# Patient Record
Sex: Male | Born: 1935 | Race: White | Hispanic: No | Smoking: Never smoker
Health system: Southern US, Community
[De-identification: ages and names within clinical notes are randomized; demographics above are authoritative.]

## PROBLEM LIST (undated history)

## (undated) DIAGNOSIS — F329 Major depressive disorder, single episode, unspecified: Secondary | ICD-10-CM

## (undated) DIAGNOSIS — E039 Hypothyroidism, unspecified: Secondary | ICD-10-CM

## (undated) DIAGNOSIS — M1990 Unspecified osteoarthritis, unspecified site: Secondary | ICD-10-CM

## (undated) DIAGNOSIS — K5900 Constipation, unspecified: Secondary | ICD-10-CM

---

## 2021-01-08 ENCOUNTER — Observation Stay
Admission: EM | Admit: 2021-01-08 | Discharge: 2021-01-10 | Disposition: A | Discharge status: Home / Self Care | Payer: Medicare Other | Attending: Internal Medicine | Admitting: Internal Medicine

## 2021-01-08 ENCOUNTER — Emergency Department: Payer: Medicare Other

## 2021-01-08 ENCOUNTER — Other Ambulatory Visit: Payer: Self-pay

## 2021-01-08 DIAGNOSIS — R4182 Altered mental status, unspecified: Secondary | ICD-10-CM | POA: Diagnosis not present

## 2021-01-08 DIAGNOSIS — Z79899 Other long term (current) drug therapy: Secondary | ICD-10-CM | POA: Insufficient documentation

## 2021-01-08 DIAGNOSIS — N179 Acute kidney failure, unspecified: Secondary | ICD-10-CM

## 2021-01-08 DIAGNOSIS — W19XXXA Unspecified fall, initial encounter: Secondary | ICD-10-CM | POA: Insufficient documentation

## 2021-01-08 DIAGNOSIS — R778 Other specified abnormalities of plasma proteins: Secondary | ICD-10-CM | POA: Insufficient documentation

## 2021-01-08 DIAGNOSIS — L8990 Pressure ulcer of unspecified site, unspecified stage: Secondary | ICD-10-CM | POA: Insufficient documentation

## 2021-01-08 DIAGNOSIS — R7989 Other specified abnormal findings of blood chemistry: Secondary | ICD-10-CM | POA: Diagnosis present

## 2021-01-08 DIAGNOSIS — S2232XA Fracture of one rib, left side, initial encounter for closed fracture: Secondary | ICD-10-CM | POA: Diagnosis not present

## 2021-01-08 DIAGNOSIS — Y92129 Unspecified place in nursing home as the place of occurrence of the external cause: Secondary | ICD-10-CM | POA: Diagnosis not present

## 2021-01-08 DIAGNOSIS — S299XXA Unspecified injury of thorax, initial encounter: Secondary | ICD-10-CM | POA: Diagnosis present

## 2021-01-08 DIAGNOSIS — S2239XA Fracture of one rib, unspecified side, initial encounter for closed fracture: Secondary | ICD-10-CM

## 2021-01-08 DIAGNOSIS — R001 Bradycardia, unspecified: Secondary | ICD-10-CM | POA: Diagnosis not present

## 2021-01-08 DIAGNOSIS — Z20822 Contact with and (suspected) exposure to covid-19: Secondary | ICD-10-CM | POA: Insufficient documentation

## 2021-01-08 DIAGNOSIS — E039 Hypothyroidism, unspecified: Secondary | ICD-10-CM | POA: Diagnosis not present

## 2021-01-08 DIAGNOSIS — R296 Repeated falls: Secondary | ICD-10-CM | POA: Diagnosis not present

## 2021-01-08 HISTORY — DX: Unspecified osteoarthritis, unspecified site: M19.90

## 2021-01-08 HISTORY — DX: Major depressive disorder, single episode, unspecified: F32.9

## 2021-01-08 HISTORY — DX: Constipation, unspecified: K59.00

## 2021-01-08 HISTORY — DX: Hypothyroidism, unspecified: E03.9

## 2021-01-08 LAB — COMPREHENSIVE METABOLIC PANEL
ALT: 31 U/L (ref 0–44)
AST: 74 U/L — ABNORMAL HIGH (ref 15–41)
Albumin: 3.6 g/dL (ref 3.5–5.0)
Alkaline Phosphatase: 40 U/L (ref 38–126)
Anion gap: 5 (ref 5–15)
BUN: 31 mg/dL — ABNORMAL HIGH (ref 8–23)
CO2: 27 mmol/L (ref 22–32)
Calcium: 9.1 mg/dL (ref 8.9–10.3)
Chloride: 98 mmol/L (ref 98–111)
Creatinine, Ser: 1.29 mg/dL — ABNORMAL HIGH (ref 0.61–1.24)
GFR, Estimated: 52 mL/min — ABNORMAL LOW (ref >60)
Glucose, Bld: 120 mg/dL — ABNORMAL HIGH (ref 70–99)
Potassium: 4.2 mmol/L (ref 3.5–5.1)
Sodium: 130 mmol/L — ABNORMAL LOW (ref 135–145)
Total Bilirubin: 1.1 mg/dL (ref 0.3–1.2)
Total Protein: 6 g/dL — ABNORMAL LOW (ref 6.5–8.1)

## 2021-01-08 LAB — CBC WITH DIFFERENTIAL/PLATELET
Abs Immature Granulocytes: 0.02 10*3/uL (ref 0.00–0.07)
Basophils Absolute: 0 10*3/uL (ref 0.0–0.1)
Basophils Relative: 0 %
Eosinophils Absolute: 0.1 10*3/uL (ref 0.0–0.5)
Eosinophils Relative: 2 %
HCT: 36.4 % — ABNORMAL LOW (ref 39.0–52.0)
Hemoglobin: 12.6 g/dL — ABNORMAL LOW (ref 13.0–17.0)
Immature Granulocytes: 0 %
Lymphocytes Relative: 6 %
Lymphs Abs: 0.4 10*3/uL — ABNORMAL LOW (ref 0.7–4.0)
MCH: 32.1 pg (ref 26.0–34.0)
MCHC: 34.6 g/dL (ref 30.0–36.0)
MCV: 92.6 fL (ref 80.0–100.0)
Monocytes Absolute: 0.8 10*3/uL (ref 0.1–1.0)
Monocytes Relative: 12 %
Neutro Abs: 5.1 10*3/uL (ref 1.7–7.7)
Neutrophils Relative %: 80 %
Platelets: 154 10*3/uL (ref 150–400)
RBC: 3.93 MIL/uL — ABNORMAL LOW (ref 4.22–5.81)
RDW: 14.6 % (ref 11.5–15.5)
WBC: 6.4 10*3/uL (ref 4.0–10.5)
nRBC: 0 % (ref 0.0–0.2)

## 2021-01-08 LAB — RESP PANEL BY RT-PCR (FLU A&B, COVID) ARPGX2
Influenza A by PCR: NEGATIVE
Influenza B by PCR: NEGATIVE
SARS Coronavirus 2 by RT PCR: NEGATIVE

## 2021-01-08 LAB — TROPONIN I (HIGH SENSITIVITY): Troponin I (High Sensitivity): 781 ng/L (ref <18)

## 2021-01-08 LAB — TSH: TSH: 2.17 u[IU]/mL (ref 0.350–4.500)

## 2021-01-08 MED ORDER — ASPIRIN 81 MG PO CHEW
324.0000 mg | CHEWABLE_TABLET | Freq: Once | ORAL | Status: AC
  Administered 2021-01-08: 324 mg via ORAL
  Filled 2021-01-08: qty 4

## 2021-01-08 MED ORDER — LACTATED RINGERS IV BOLUS
1000.0000 mL | Freq: Once | INTRAVENOUS | Status: AC
  Administered 2021-01-08: 1000 mL via INTRAVENOUS

## 2021-01-08 NOTE — ED Triage Notes (Signed)
Patient from [facility] with 3 falls since yesterday, denies any complaint of pain or injury.  Patient awake and alert.

## 2021-01-08 NOTE — ED Provider Notes (Signed)
[facility] Emergency Department Provider Note   ____________________________________________   Event Date/Time   First MD Initiated Contact with Patient 01/08/21 1916     (approximate)  I have reviewed the triage vital signs and the nursing notes.   HISTORY  Chief Complaint Fall (Patient from [facility], EMS states staff told them patient has fallen 3 x since yesterday, most recent fall was 30 minutes PTA.  Patient has no complaints of pain. No injury noted.)    HPI [name] [name] is a 87 y.o. male with past medical history of hypothyroidism, osteoarthritis, and major depressive disorder who presents to the ED for falls.  History is limited due to patient's confusion.  Patient states that he has been falling lately due to issues with his balance, per nursing facility where he lives he has had 3 falls in the past 24 hours.  He denies hitting his head or losing consciousness with the fall today and denies any pain.  He states his balance has been getting worse but he denies any numbness or weakness.  He denies any fevers, cough, chest pain, shortness of breath, vomiting, diarrhea, or dysuria.  He does not take any blood thinners.        Past Medical History:  Diagnosis Date   Constipation    Hypothyroid    Major depressive disorder    Osteoarthritis (arthritis due to wear and tear of joints)     There are no problems to display for this patient.   History reviewed. No pertinent surgical history.  Prior to Admission medications   Not on File    Allergies Patient has no known allergies.  History reviewed. No pertinent family history.  Social History    Review of Systems  Constitutional: No fever/chills.  Positive for multiple falls. Eyes: No visual changes. ENT: No sore throat. Cardiovascular: Denies chest pain. Respiratory: Denies shortness of breath. Gastrointestinal: No abdominal pain.  No nausea, no vomiting.  No diarrhea.  No  constipation. Genitourinary: Negative for dysuria. Musculoskeletal: Negative for back pain. Skin: Negative for rash. Neurological: Negative for headaches, focal weakness or numbness.  ____________________________________________   PHYSICAL EXAM:  VITAL SIGNS: ED Triage Vitals  Enc Vitals Group     BP 01/08/21 1843 (!) 126/43     Pulse Rate 01/08/21 1843 66     Resp 01/08/21 1843 20     Temp 01/08/21 1843 98 F (36.7 C)     Temp src --      SpO2 01/08/21 1843 93 %     Weight 01/08/21 1845 185 lb 9.6 oz (84.2 kg)     Height 01/08/21 1845 5\' 8"  (1.727 m)     Head Circumference --      Peak Flow --      Pain Score 01/08/21 1845 0     Pain Loc --      Pain Edu? --      Excl. in GC? --     Constitutional: Alert and oriented to person and place, but not time or situation. Eyes: Conjunctivae are normal. Head: Atraumatic. Nose: No congestion/rhinnorhea. Mouth/Throat: Mucous membranes are moist. Neck: Normal ROM Cardiovascular: Normal rate, regular rhythm. Grossly normal heart sounds.  2+ radial pulses bilaterally. Respiratory: Normal respiratory effort.  No retractions. Lungs CTAB. Gastrointestinal: Soft and nontender. No distention. Genitourinary: deferred Musculoskeletal: No lower extremity tenderness nor edema.  No upper extremity bony tenderness to palpation. Neurologic:  Normal speech and language. No gross focal neurologic deficits are appreciated. Skin:  Skin is warm, dry and intact. No rash noted. Psychiatric: Mood and affect are normal. Speech and behavior are normal.  ____________________________________________   LABS (all labs ordered are listed, but only abnormal results are displayed)  Labs Reviewed  CBC WITH DIFFERENTIAL/PLATELET - Abnormal; Notable for the following components:      Result Value   RBC 3.93 (*)    Hemoglobin 12.6 (*)    HCT 36.4 (*)    Lymphs Abs 0.4 (*)    All other components within normal limits  COMPREHENSIVE METABOLIC PANEL -  Abnormal; Notable for the following components:   Sodium 130 (*)    Glucose, Bld 120 (*)    BUN 31 (*)    Creatinine, Ser 1.29 (*)    Total Protein 6.0 (*)    AST 74 (*)    GFR, Estimated 52 (*)    All other components within normal limits  TROPONIN I (HIGH SENSITIVITY) - Abnormal; Notable for the following components:   Troponin I (High Sensitivity) 781 (*)    All other components within normal limits  RESP PANEL BY RT-PCR (FLU A&B, COVID) ARPGX2  TSH  URINALYSIS, COMPLETE (UACMP) WITH MICROSCOPIC  TROPONIN I (HIGH SENSITIVITY)   ____________________________________________  EKG  ED ECG REPORT I, Chesley Noon, the attending physician, personally viewed and interpreted this ECG.   Date: 01/08/2021  EKG Time: 20:23  Rate: 47  Rhythm: sinus bradycardia  Axis: LAD  Intervals:right bundle branch block and left anterior fascicular block  ST&T Change: None   PROCEDURES  Procedure(s) performed (including Critical Care):  Procedures   ____________________________________________   INITIAL IMPRESSION / ASSESSMENT AND PLAN / ED COURSE      87 year old male with past medical history of hypothyroidism, osteoarthritis, and major depressive disorder who presents to the ED with multiple falls in the past 24 hours.  He denies any pain and does not appear to have any obvious injuries, given his advanced age we will check CT head and cervical spine.  He does appear confused with no history of dementia noted on nursing home paperwork.  No focal neurologic deficits noted, we will further assess for metabolic versus infectious etiology.  CT head and cervical spine are negative for acute process, chest x-ray reviewed by me and shows no infiltrate, edema, or effusion but does note questionable rib fracture.  On reassessment patient with no chest wall tenderness to suggest acute rib fracture.  Labs remarkable for mild AKI along with elevated troponin.  No ischemic changes noted on EKG  however he does appear to be in an irregular sinus bradycardia.  We will give dose of aspirin and trend troponin.  Case discussed with hospitalist for admission, who will follow up repeat troponin and initiate heparin if it is uptrending.      ____________________________________________   FINAL CLINICAL IMPRESSION(S) / ED DIAGNOSES  Final diagnoses:  Multiple falls  Elevated troponin  Altered mental status, unspecified altered mental status type     ED Discharge Orders     None        Note:  This document was prepared using Dragon voice recognition software and may include unintentional dictation errors.    Chesley Noon, MD 01/08/21 2249

## 2021-01-09 ENCOUNTER — Observation Stay
Admit: 2021-01-09 | Discharge: 2021-01-09 | Disposition: A | Discharge status: Home / Self Care | Payer: Medicare Other | Attending: Family Medicine | Admitting: Family Medicine

## 2021-01-09 ENCOUNTER — Encounter: Payer: Self-pay | Admitting: Family Medicine

## 2021-01-09 DIAGNOSIS — R296 Repeated falls: Secondary | ICD-10-CM | POA: Diagnosis not present

## 2021-01-09 DIAGNOSIS — R001 Bradycardia, unspecified: Secondary | ICD-10-CM

## 2021-01-09 DIAGNOSIS — S2232XA Fracture of one rib, left side, initial encounter for closed fracture: Secondary | ICD-10-CM

## 2021-01-09 DIAGNOSIS — R778 Other specified abnormalities of plasma proteins: Secondary | ICD-10-CM | POA: Diagnosis not present

## 2021-01-09 DIAGNOSIS — N179 Acute kidney failure, unspecified: Secondary | ICD-10-CM | POA: Diagnosis not present

## 2021-01-09 DIAGNOSIS — S2239XA Fracture of one rib, unspecified side, initial encounter for closed fracture: Secondary | ICD-10-CM

## 2021-01-09 LAB — COMPREHENSIVE METABOLIC PANEL
ALT: 31 U/L (ref 0–44)
AST: 73 U/L — ABNORMAL HIGH (ref 15–41)
Albumin: 3.7 g/dL (ref 3.5–5.0)
Alkaline Phosphatase: 35 U/L — ABNORMAL LOW (ref 38–126)
Anion gap: 8 (ref 5–15)
BUN: 26 mg/dL — ABNORMAL HIGH (ref 8–23)
CO2: 26 mmol/L (ref 22–32)
Calcium: 9 mg/dL (ref 8.9–10.3)
Chloride: 99 mmol/L (ref 98–111)
Creatinine, Ser: 0.99 mg/dL (ref 0.61–1.24)
GFR, Estimated: >60 mL/min (ref >60)
Glucose, Bld: 95 mg/dL (ref 70–99)
Potassium: 4.2 mmol/L (ref 3.5–5.1)
Sodium: 133 mmol/L — ABNORMAL LOW (ref 135–145)
Total Bilirubin: 1.3 mg/dL — ABNORMAL HIGH (ref 0.3–1.2)
Total Protein: 6.1 g/dL — ABNORMAL LOW (ref 6.5–8.1)

## 2021-01-09 LAB — URINALYSIS, COMPLETE (UACMP) WITH MICROSCOPIC
Bilirubin Urine: NEGATIVE
Glucose, UA: NEGATIVE mg/dL
Hgb urine dipstick: NEGATIVE
Ketones, ur: NEGATIVE mg/dL
Nitrite: NEGATIVE
Protein, ur: NEGATIVE mg/dL
Specific Gravity, Urine: 1.015 (ref 1.005–1.030)
Squamous Epithelial / HPF: NONE SEEN (ref 0–5)
pH: 6.5 (ref 5.0–8.0)

## 2021-01-09 LAB — TROPONIN I (HIGH SENSITIVITY): Troponin I (High Sensitivity): 548 ng/L (ref <18)

## 2021-01-09 MED ORDER — FERROUS SULFATE 325 (65 FE) MG PO TABS
325.0000 mg | ORAL_TABLET | Freq: Every day | ORAL | Status: DC
  Administered 2021-01-09 – 2021-01-10 (×2): 325 mg via ORAL
  Filled 2021-01-09 (×2): qty 1

## 2021-01-09 MED ORDER — ASPIRIN EC 81 MG PO TBEC
81.0000 mg | DELAYED_RELEASE_TABLET | Freq: Every day | ORAL | Status: DC
  Administered 2021-01-09 – 2021-01-10 (×2): 81 mg via ORAL
  Filled 2021-01-09 (×2): qty 1

## 2021-01-09 MED ORDER — DOCUSATE SODIUM 100 MG PO CAPS
100.0000 mg | ORAL_CAPSULE | Freq: Every day | ORAL | Status: DC
  Administered 2021-01-09 – 2021-01-10 (×2): 100 mg via ORAL
  Filled 2021-01-09 (×2): qty 1

## 2021-01-09 MED ORDER — PANTOPRAZOLE SODIUM 40 MG PO TBEC
40.0000 mg | DELAYED_RELEASE_TABLET | Freq: Every day | ORAL | Status: DC
  Administered 2021-01-09 – 2021-01-10 (×2): 40 mg via ORAL
  Filled 2021-01-09 (×2): qty 1

## 2021-01-09 MED ORDER — VITAMIN D 25 MCG (1000 UNIT) PO TABS
1000.0000 [IU] | ORAL_TABLET | Freq: Every day | ORAL | Status: DC
Start: 2021-01-09 — End: 2021-01-10
  Administered 2021-01-09 – 2021-01-10 (×2): 1000 [IU] via ORAL
  Filled 2021-01-09 (×2): qty 1

## 2021-01-09 MED ORDER — LEVOTHYROXINE SODIUM 50 MCG PO TABS
50.0000 ug | ORAL_TABLET | Freq: Every day | ORAL | Status: DC
  Administered 2021-01-09 – 2021-01-10 (×2): 50 ug via ORAL
  Filled 2021-01-09 (×2): qty 1

## 2021-01-09 NOTE — ED Notes (Signed)
Cardiology at bedside.

## 2021-01-09 NOTE — ED Notes (Signed)
Patient is resting comfortably. 

## 2021-01-09 NOTE — ED Notes (Signed)
PT at bedside.

## 2021-01-09 NOTE — Consult Note (Signed)
[name] [name] is a 87 y.o. male  [id number]  Primary Cardiologist: Adrian Blackwater Reason for Consultation: Elevated troponin  HPI: This is a 87 year old white male who presented to the hospital after falling down from nursing home.  Patient denies any chest pain or shortness of breath.   Review of Systems: No chest pain or shortness of breath   Past Medical History:  Diagnosis Date   Constipation    Hypothyroid    Major depressive disorder    Osteoarthritis (arthritis due to wear and tear of joints)     (Not in a hospital admission)     aspirin EC  81 mg Oral Daily   cholecalciferol  1,000 Units Oral Daily   docusate sodium  100 mg Oral Daily   ferrous sulfate  325 mg Oral Q1200   levothyroxine  50 mcg Oral Q0600   pantoprazole  40 mg Oral Daily    Infusions:   No Known Allergies  Social History   Socioeconomic History   Marital status: Widowed    Spouse name: Not on file   Number of children: Not on file   Years of education: Not on file   Highest education level: Not on file  Occupational History   Not on file  Tobacco Use   Smoking status: Not on file   Smokeless tobacco: Not on file  Substance and Sexual Activity   Alcohol use: Not on file   Drug use: Not on file   Sexual activity: Not on file  Other Topics Concern   Not on file  Social History Narrative   Not on file   Social Determinants of Health   Financial Resource Strain: Not on file  Food Insecurity: Not on file  Transportation Needs: Not on file  Physical Activity: Not on file  Stress: Not on file  Social Connections: Not on file  Intimate Partner Violence: Not on file    History reviewed. No pertinent family history.  PHYSICAL EXAM: Vitals:   01/09/21 1307 01/09/21 1308  BP:  136/64  Pulse:  (!) 30  Resp:    Temp: 98.2 F (36.8 C) 98.2 F (36.8 C)  SpO2:  95%     Intake/Output Summary (Last 24 hours) at 01/09/2021 1357 Last data filed at 01/09/2021 0906 Gross per 24 hour   Intake --  Output 825 ml  Net -825 ml    General:  Well appearing. No respiratory difficulty HEENT: normal Neck: supple. no JVD. Carotids 2+ bilat; no bruits. No lymphadenopathy or thryomegaly appreciated. Cor: PMI nondisplaced. Regular rate & rhythm. No rubs, gallops or murmurs. Lungs: clear Abdomen: soft, nontender, nondistended. No hepatosplenomegaly. No bruits or masses. Good bowel sounds. Extremities: no cyanosis, clubbing, rash, edema Neuro: alert & oriented x 3, cranial nerves grossly intact. moves all 4 extremities w/o difficulty. Affect pleasant.  ECG: Sinus bradycardia 47 bpm with occasional APCs  Results for orders placed or performed during the hospital encounter of 01/08/21 (from the past 24 hour(s))  CBC with Differential     Status: Abnormal   Collection Time: 01/08/21  9:15 PM  Result Value Ref Range   WBC 6.4 4.0 - 10.5 K/uL   RBC 3.93 (L) 4.22 - 5.81 MIL/uL   Hemoglobin 12.6 (L) 13.0 - 17.0 g/dL   HCT 35.0 (L) 09.3 - 81.8 %   MCV 92.6 80.0 - 100.0 fL   MCH 32.1 26.0 - 34.0 pg   MCHC 34.6 30.0 - 36.0 g/dL   RDW 29.9 37.1 -  15.5 %   Platelets 154 150 - 400 K/uL   nRBC 0.0 0.0 - 0.2 %   Neutrophils Relative % 80 %   Neutro Abs 5.1 1.7 - 7.7 K/uL   Lymphocytes Relative 6 %   Lymphs Abs 0.4 (L) 0.7 - 4.0 K/uL   Monocytes Relative 12 %   Monocytes Absolute 0.8 0.1 - 1.0 K/uL   Eosinophils Relative 2 %   Eosinophils Absolute 0.1 0.0 - 0.5 K/uL   Basophils Relative 0 %   Basophils Absolute 0.0 0.0 - 0.1 K/uL   Immature Granulocytes 0 %   Abs Immature Granulocytes 0.02 0.00 - 0.07 K/uL  Comprehensive metabolic panel     Status: Abnormal   Collection Time: 01/08/21  9:15 PM  Result Value Ref Range   Sodium 130 (L) 135 - 145 mmol/L   Potassium 4.2 3.5 - 5.1 mmol/L   Chloride 98 98 - 111 mmol/L   CO2 27 22 - 32 mmol/L   Glucose, Bld 120 (H) 70 - 99 mg/dL   BUN 31 (H) 8 - 23 mg/dL   Creatinine, Ser 9.16 (H) 0.61 - 1.24 mg/dL   Calcium 9.1 8.9 - 38.4 mg/dL    Total Protein 6.0 (L) 6.5 - 8.1 g/dL   Albumin 3.6 3.5 - 5.0 g/dL   AST 74 (H) 15 - 41 U/L   ALT 31 0 - 44 U/L   Alkaline Phosphatase 40 38 - 126 U/L   Total Bilirubin 1.1 0.3 - 1.2 mg/dL   GFR, Estimated 52 (L) >60 mL/min   Anion gap 5 5 - 15  Troponin I (High Sensitivity)     Status: Abnormal   Collection Time: 01/08/21  9:15 PM  Result Value Ref Range   Troponin I (High Sensitivity) 781 (HH) <18 ng/L  Resp Panel by RT-PCR (Flu A&B, Covid) Nasopharyngeal Swab     Status: None   Collection Time: 01/08/21  9:15 PM   Specimen: Nasopharyngeal Swab; Nasopharyngeal(NP) swabs in vial transport medium  Result Value Ref Range   SARS Coronavirus 2 by RT PCR NEGATIVE NEGATIVE   Influenza A by PCR NEGATIVE NEGATIVE   Influenza B by PCR NEGATIVE NEGATIVE  TSH     Status: None   Collection Time: 01/08/21  9:15 PM  Result Value Ref Range   TSH 2.170 0.350 - 4.500 uIU/mL  Troponin I (High Sensitivity)     Status: Abnormal   Collection Time: 01/08/21 11:58 PM  Result Value Ref Range   Troponin I (High Sensitivity) 548 (HH) <18 ng/L  Urinalysis, Complete w Microscopic Urine, Clean Catch     Status: Abnormal   Collection Time: 01/09/21 12:25 AM  Result Value Ref Range   Color, Urine YELLOW YELLOW   APPearance CLEAR CLEAR   Specific Gravity, Urine 1.015 1.005 - 1.030   pH 6.5 5.0 - 8.0   Glucose, UA NEGATIVE NEGATIVE mg/dL   Hgb urine dipstick NEGATIVE NEGATIVE   Bilirubin Urine NEGATIVE NEGATIVE   Ketones, ur NEGATIVE NEGATIVE mg/dL   Protein, ur NEGATIVE NEGATIVE mg/dL   Nitrite NEGATIVE NEGATIVE   Leukocytes,Ua SMALL (A) NEGATIVE   RBC / HPF 0-5 0 - 5 RBC/hpf   WBC, UA 11-20 0 - 5 WBC/hpf   Bacteria, UA RARE (A) NONE SEEN   Squamous Epithelial / LPF NONE SEEN 0 - 5  Comprehensive metabolic panel     Status: Abnormal   Collection Time: 01/09/21  7:29 AM  Result Value Ref Range   Sodium 133 (L)  135 - 145 mmol/L   Potassium 4.2 3.5 - 5.1 mmol/L   Chloride 99 98 - 111 mmol/L   CO2 26  22 - 32 mmol/L   Glucose, Bld 95 70 - 99 mg/dL   BUN 26 (H) 8 - 23 mg/dL   Creatinine, Ser 1.610.99 0.61 - 1.24 mg/dL   Calcium 9.0 8.9 - 09.610.3 mg/dL   Total Protein 6.1 (L) 6.5 - 8.1 g/dL   Albumin 3.7 3.5 - 5.0 g/dL   AST 73 (H) 15 - 41 U/L   ALT 31 0 - 44 U/L   Alkaline Phosphatase 35 (L) 38 - 126 U/L   Total Bilirubin 1.3 (H) 0.3 - 1.2 mg/dL   GFR, Estimated >04>60 >54>60 mL/min   Anion gap 8 5 - 15   DG Chest 2 View  Result Date: 01/08/2021 CLINICAL DATA:  Weakness.  Three falls since yesterday. EXAM: CHEST - 2 VIEW COMPARISON:  None. FINDINGS: The heart is upper normal in size. Aortic atherosclerosis and tortuosity. 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 Mild adjacent right lung base atelectasis. No pneumothorax, pleural effusion, confluent airspace disease or pulmonary edema. Bones are diffusely under mineralized. Possible left lateral ninth rib fracture, incompletely included in the field of view, and of unknown acuity. IMPRESSION: 1. Possible left lateral ninth rib fracture, of unknown acuity. Recommend correlation with point tenderness. 2. No other acute findings. 3. Aortic atherosclerosis and tortuosity. Mitral annulus calcifications. Electronically Signed   By: Narda RutherfordMelanie  Sanford M.D.   On: 01/08/2021 20:23   CT Head Wo Contrast  Result Date: 01/08/2021 CLINICAL DATA:  Head trauma EXAM: CT HEAD WITHOUT CONTRAST CT CERVICAL SPINE WITHOUT CONTRAST TECHNIQUE: Multidetector CT imaging of the head and cervical spine was performed following the standard protocol without intravenous contrast. Multiplanar CT image reconstructions of the cervical spine were also generated. COMPARISON:  None. FINDINGS: CT HEAD FINDINGS Brain: No acute territorial infarction, hemorrhage or intracranial mass. Moderate atrophy. Moderate hypodensity in the white matter consistent with chronic small vessel ischemic change. Chronic appearing lacunar infarct in  the left basal ganglia. Nonenlarged ventricles Vascular: No hyperdense vessels.  Carotid vascular calcification Skull: Normal. Negative for fracture or focal lesion. Sinuses/Orbits: No acute finding. Other: None CT CERVICAL SPINE FINDINGS Alignment: Generalized straightening. 6 mm anterolisthesis C4 on C5. Facet alignment is maintained. Skull base and vertebrae: No acute fracture. No primary bone lesion or focal pathologic process. Soft tissues and spinal canal: No prevertebral fluid or swelling. No visible canal hematoma. Disc levels: Advanced degenerative changes throughout the cervical spine with multiple level disc space narrowing osteophyte. Hypertrophic facet degenerative changes at multiple levels with foraminal narrowing. Upper chest: Apical scarring with calcifications on the right. Other: None IMPRESSION: 1. No CT evidence for acute intracranial abnormality. Atrophy and chronic small vessel ischemic changes of the white matter. 2. Straightening of the cervical spine with 6 mm anterolisthesis C4 on C5. Advanced degenerative changes throughout the cervical spine. No acute fracture is seen Electronically Signed   By: Jasmine PangKim  Fujinaga M.D.   On: 01/08/2021 21:19   CT Cervical Spine Wo Contrast  Result Date: 01/08/2021 CLINICAL DATA:  Head trauma EXAM: CT HEAD WITHOUT CONTRAST CT CERVICAL SPINE WITHOUT CONTRAST TECHNIQUE: Multidetector CT imaging of the head and cervical spine was performed following the standard protocol without intravenous contrast. Multiplanar CT image reconstructions of the cervical spine were also generated. COMPARISON:  None. FINDINGS: CT HEAD FINDINGS Brain: No acute territorial infarction, hemorrhage or  intracranial mass. Moderate atrophy. Moderate hypodensity in the white matter consistent with chronic small vessel ischemic change. Chronic appearing lacunar infarct in the left basal ganglia. Nonenlarged ventricles Vascular: No hyperdense vessels.  Carotid vascular calcification Skull:  Normal. Negative for fracture or focal lesion. Sinuses/Orbits: No acute finding. Other: None CT CERVICAL SPINE FINDINGS Alignment: Generalized straightening. 6 mm anterolisthesis C4 on C5. Facet alignment is maintained. Skull base and vertebrae: No acute fracture. No primary bone lesion or focal pathologic process. Soft tissues and spinal canal: No prevertebral fluid or swelling. No visible canal hematoma. Disc levels: Advanced degenerative changes throughout the cervical spine with multiple level disc space narrowing osteophyte. Hypertrophic facet degenerative changes at multiple levels with foraminal narrowing. Upper chest: Apical scarring with calcifications on the right. Other: None IMPRESSION: 1. No CT evidence for acute intracranial abnormality. Atrophy and chronic small vessel ischemic changes of the white matter. 2. Straightening of the cervical spine with 6 mm anterolisthesis C4 on C5. Advanced degenerative changes throughout the cervical spine. No acute fracture is seen Electronically Signed   By: Jasmine Pang M.D.   On: 01/08/2021 21:19     ASSESSMENT AND PLAN: Elevated troponin without chest pain or any acute EKG changes.  Advise aspirin and statin and treat the patient medically.  Patient has sinus bradycardia with occasional APCs but is asymptomatic.  It does not appear that the patient is on any beta-blocker or digoxin or Cardizem at nursing home.  Will observe the patient since patient is asymptomatic.  Karalina Tift A

## 2021-01-09 NOTE — H&P (Signed)
History and Physical    [name] [name] ZOX:[id number] DOB: [birth year] DOA: 01/08/2021  PCP: System, Provider Not In  Patient coming from: [place]  I have personally briefly reviewed patient's old medical records in [facility]  Chief Complaint: recurrent falls  HPI: [name] is a 87 y.o. male with medical history significant for hypothyroidism, osteoarthritis, GERD and depression who presents with concerns of multiple falls from SNF.  Patient reports that he had 3 falls in the past 24 hours.  First fall was when he was trying to get up out of a chair and fell onto the floor.  States he "made a mess of the room" but could not clarify to me what that exactly means.  Denies urinary or bowel incontinence.  Then states this morning he had a second fall when he was trying to get out of bed but fell out.  The third fall happened when he was trying to go to the bathroom but his legs got weak any could not grab onto his walker.  States he tried to scoot to the entrance of his door but it was an hour before anyone found him around dinnertime.  Reports being more sleepy.  Denies any chest pain, shortness of breath.  No nausea, vomiting or diarrhea.  ED Course: He was afebrile and normotensive on room air.  No leukocytosis.  Hemoglobin 12.6.  Sodium of 130.  Creatinine of 1.29 with no prior for comparison.  BG of 120.  AST of 74 with normal ALT.  TSH was normal.  Troponin was found to be significantly elevated 781 with EKG showing sinus bradycardia. Negative COVID PCR Chest x-ray showed left lateral ninth rib fracture of unknown acuity in Negative CT head. CT of the cervical spine with no fracture  Review of Systems: Constitutional: No Weight Change, No Fever ENT/Mouth: No sore throat, No Rhinorrhea Eyes: No Eye Pain, No Vision Changes Cardiovascular: No Chest Pain, no SOB,  No Palpitations Respiratory: No Cough, Gastrointestinal: No Nausea, No Vomiting, No Diarrhea Genitourinary: no  Urinary Incontinence Musculoskeletal: No Arthralgias, No Myalgias Skin: No Skin Lesions, No Pruritus, Neuro: + Weakness, No Numbness,  No Loss of Consciousness, No Syncope Psych: No Anxiety/Panic, No Depression, no decrease appetite Heme/Lymph: No Bruising, No Bleeding  Past Medical History:  Diagnosis Date   Constipation    Hypothyroid    Major depressive disorder    Osteoarthritis (arthritis due to wear and tear of joints)     History reviewed. No pertinent surgical history.   Social History Denies tobacco, etoh or illicit drug use  No Known Allergies  History reviewed. No pertinent family history.   Prior to Admission medications   Medication Sig Start Date End Date Taking? Authorizing Provider  meloxicam (MOBIC) 15 MG tablet Take 15 mg by mouth daily. 12/18/20   [provider]  omeprazole (PRILOSEC) 20 MG capsule Take 20 mg by mouth daily. 12/18/20   [provider]    Physical Exam: Vitals:   01/08/21 1843 01/08/21 1845 01/08/21 2300 01/09/21 0000  BP: (!) 126/43  (!) 129/58 118/66  Pulse: 66  (!) 43 (!) 48  Resp: 20  (!) 21 (!) 24  Temp: 98 F (36.7 C)     SpO2: 93%  99% 95%  Weight:  84.2 kg    Height:  5\' 8"  (1.727 m)      Constitutional: NAD, calm, comfortable, elderly gentleman laying at approximately 30 degree in bed.  Patient speaks in a soft raspy voice. Vitals:  01/08/21 1843 01/08/21 1845 01/08/21 2300 01/09/21 0000  BP: (!) 126/43  (!) 129/58 118/66  Pulse: 66  (!) 43 (!) 48  Resp: 20  (!) 21 (!) 24  Temp: 98 F (36.7 C)     SpO2: 93%  99% 95%  Weight:  84.2 kg    Height:  5\' 8"  (1.727 m)     Eyes: PERRL, lids and conjunctivae normal ENMT: Mucous membranes are moist.  Neck: normal, supple Respiratory: clear to auscultation bilaterally, no wheezing, no crackles. Normal respiratory effort. No accessory muscle use.  Cardiovascular: Sinus bradycardia, no murmurs / rubs / gallops. No extremity edema. 2+ pedal pulses. No carotid  bruits.  Abdomen: no tenderness, no masses palpated. Bowel sounds positive.  Musculoskeletal: no clubbing / cyanosis. No joint deformity upper and lower extremities. Good ROM, no contractures.  Muscle wasting in all 4 extremities Skin: Superficial abrasion on the right anterior patella region Neurologic: CN 2-12 grossly intact. Sensation intact, Strength 5/5 in all 4.  Psychiatric: Normal judgment and insight. Alert and oriented x 3. Normal mood.     Labs on Admission: I have personally reviewed following labs and imaging studies  CBC: Recent Labs  Lab 01/08/21 2115  WBC 6.4  NEUTROABS 5.1  HGB 12.6*  HCT 36.4*  MCV 92.6  PLT 154   Basic Metabolic Panel: Recent Labs  Lab 01/08/21 2115  NA 130*  K 4.2  CL 98  CO2 27  GLUCOSE 120*  BUN 31*  CREATININE 1.29*  CALCIUM 9.1   GFR: Estimated Creatinine Clearance: 37.8 mL/min (A) (by C-G formula based on SCr of 1.29 mg/dL (H)). Liver Function Tests: Recent Labs  Lab 01/08/21 2115  AST 74*  ALT 31  ALKPHOS 40  BILITOT 1.1  PROT 6.0*  ALBUMIN 3.6   No results for input(s): LIPASE, AMYLASE in the last 168 hours. No results for input(s): AMMONIA in the last 168 hours. Coagulation Profile: No results for input(s): INR, PROTIME in the last 168 hours. Cardiac Enzymes: No results for input(s): CKTOTAL, CKMB, CKMBINDEX, TROPONINI in the last 168 hours. BNP (last 3 results) No results for input(s): PROBNP in the last 8760 hours. HbA1C: No results for input(s): HGBA1C in the last 72 hours. CBG: No results for input(s): GLUCAP in the last 168 hours. Lipid Profile: No results for input(s): CHOL, HDL, LDLCALC, TRIG, CHOLHDL, LDLDIRECT in the last 72 hours. Thyroid Function Tests: Recent Labs    01/08/21 2115  TSH 2.170   Anemia Panel: No results for input(s): VITAMINB12, FOLATE, FERRITIN, TIBC, IRON, RETICCTPCT in the last 72 hours. Urine analysis: No results found for: COLORURINE, APPEARANCEUR, LABSPEC, PHURINE,  GLUCOSEU, HGBUR, BILIRUBINUR, KETONESUR, PROTEINUR, UROBILINOGEN, NITRITE, LEUKOCYTESUR  Radiological Exams on Admission: DG Chest 2 View  Result Date: 01/08/2021 CLINICAL DATA:  Weakness.  Three falls since yesterday. EXAM: CHEST - 2 VIEW COMPARISON:  None. FINDINGS: The heart is upper normal in size. Aortic atherosclerosis and tortuosity. 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 Mild adjacent right lung base atelectasis. No pneumothorax, pleural effusion, confluent airspace disease or pulmonary edema. Bones are diffusely under mineralized. Possible left lateral ninth rib fracture, incompletely included in the field of view, and of unknown acuity. IMPRESSION: 1. Possible left lateral ninth rib fracture, of unknown acuity. Recommend correlation with point tenderness. 2. No other acute findings. 3. Aortic atherosclerosis and tortuosity. Mitral annulus calcifications. Electronically Signed   By: Narda RutherfordMelanie  Sanford M.D.   On: 01/08/2021 20:23  CT Head Wo Contrast  Result Date: 01/08/2021 CLINICAL DATA:  Head trauma EXAM: CT HEAD WITHOUT CONTRAST CT CERVICAL SPINE WITHOUT CONTRAST TECHNIQUE: Multidetector CT imaging of the head and cervical spine was performed following the standard protocol without intravenous contrast. Multiplanar CT image reconstructions of the cervical spine were also generated. COMPARISON:  None. FINDINGS: CT HEAD FINDINGS Brain: No acute territorial infarction, hemorrhage or intracranial mass. Moderate atrophy. Moderate hypodensity in the white matter consistent with chronic small vessel ischemic change. Chronic appearing lacunar infarct in the left basal ganglia. Nonenlarged ventricles Vascular: No hyperdense vessels.  Carotid vascular calcification Skull: Normal. Negative for fracture or focal lesion. Sinuses/Orbits: No acute finding. Other: None CT CERVICAL SPINE FINDINGS Alignment: Generalized straightening. 6 mm  anterolisthesis C4 on C5. Facet alignment is maintained. Skull base and vertebrae: No acute fracture. No primary bone lesion or focal pathologic process. Soft tissues and spinal canal: No prevertebral fluid or swelling. No visible canal hematoma. Disc levels: Advanced degenerative changes throughout the cervical spine with multiple level disc space narrowing osteophyte. Hypertrophic facet degenerative changes at multiple levels with foraminal narrowing. Upper chest: Apical scarring with calcifications on the right. Other: None IMPRESSION: 1. No CT evidence for acute intracranial abnormality. Atrophy and chronic small vessel ischemic changes of the white matter. 2. Straightening of the cervical spine with 6 mm anterolisthesis C4 on C5. Advanced degenerative changes throughout the cervical spine. No acute fracture is seen Electronically Signed   By: Jasmine Pang M.D.   On: 01/08/2021 21:19   CT Cervical Spine Wo Contrast  Result Date: 01/08/2021 CLINICAL DATA:  Head trauma EXAM: CT HEAD WITHOUT CONTRAST CT CERVICAL SPINE WITHOUT CONTRAST TECHNIQUE: Multidetector CT imaging of the head and cervical spine was performed following the standard protocol without intravenous contrast. Multiplanar CT image reconstructions of the cervical spine were also generated. COMPARISON:  None. FINDINGS: CT HEAD FINDINGS Brain: No acute territorial infarction, hemorrhage or intracranial mass. Moderate atrophy. Moderate hypodensity in the white matter consistent with chronic small vessel ischemic change. Chronic appearing lacunar infarct in the left basal ganglia. Nonenlarged ventricles Vascular: No hyperdense vessels.  Carotid vascular calcification Skull: Normal. Negative for fracture or focal lesion. Sinuses/Orbits: No acute finding. Other: None CT CERVICAL SPINE FINDINGS Alignment: Generalized straightening. 6 mm anterolisthesis C4 on C5. Facet alignment is maintained. Skull base and vertebrae: No acute fracture. No primary bone  lesion or focal pathologic process. Soft tissues and spinal canal: No prevertebral fluid or swelling. No visible canal hematoma. Disc levels: Advanced degenerative changes throughout the cervical spine with multiple level disc space narrowing osteophyte. Hypertrophic facet degenerative changes at multiple levels with foraminal narrowing. Upper chest: Apical scarring with calcifications on the right. Other: None IMPRESSION: 1. No CT evidence for acute intracranial abnormality. Atrophy and chronic small vessel ischemic changes of the white matter. 2. Straightening of the cervical spine with 6 mm anterolisthesis C4 on C5. Advanced degenerative changes throughout the cervical spine. No acute fracture is seen Electronically Signed   By: Jasmine Pang M.D.   On: 01/08/2021 21:19      Assessment/Plan Elevated troponin -Patient currently only complaining of weakness and no chest pain or dyspnea - Initial troponin of 781. Will trend and if there is a significant increase will initiate heparin infusion for persumed NSTEMI  Sinus bradycardia - Asymptomatic at this time  Recurrent falls -possible cardiac related. Workup as above.  - PT eval  AKI -creatinine of 1.29 with no prior for comparison - has received IV fluid  resuscitation.  Follow with repeat labs.  Left lateral ninth rib fracture -No significant pain  DVT prophylaxis:.Lovenox Code Status: Full Family Communication: Plan discussed with patient at bedside  disposition Plan: Home with observation Consults called:  Admission status: Observation   Level of care: Progressive Cardiac  Status is: Observation  The patient remains OBS appropriate and will d/c before 2 midnights.  Dispo: The patient is from: SNF              Anticipated d/c is to: SNF              Patient currently is not medically stable to d/c.   Difficult to place patient No         Anselm Jungling DO [facility]   If 7PM-7AM, please contact  night-coverage [email or web address]   01/09/2021, 12:25 AM

## 2021-01-09 NOTE — Evaluation (Signed)
Physical Therapy Evaluation Patient Details Name: [name] [name] MRN: [id number] DOB: 12/06/1924 Today's Date: 01/09/2021   History of Present Illness  Pt is a 87 y.o. male with medical history significant for hypothyroidism, osteoarthritis, GERD and depression who presents with concerns of multiple falls from SNF. MD assessment includes: Elevated troponin, recurrent falls, AKI, and left 9th rib Fx with no significant pain.   Clinical Impression  Pt was pleasant and motivated to participate during the session. Pt required extra time and effort with bed mobility tasks and min A for transfers and gait.  Pt was only able to take a few very small, effortful, shuffling steps at the EOB with heavy lean on the RW for support.  Pt is at a very high risk for falls and would not be safe to return to his prior living situation at this time.  Pt will benefit from PT services in a SNF setting upon discharge to safely address deficits listed in patient problem list for decreased caregiver assistance and eventual return to PLOF.      Follow Up Recommendations SNF;Supervision/Assistance - 24 hour    Equipment Recommendations  None recommended by PT    Recommendations for Other Services       Precautions / Restrictions Precautions Precautions: Fall Restrictions Weight Bearing Restrictions: No      Mobility  Bed Mobility Overal bed mobility: Modified Independent             General bed mobility comments: Extra time and effort only    Transfers Overall transfer level: Needs assistance Equipment used: Rolling walker (2 wheeled) Transfers: Sit to/from Stand Sit to Stand: From elevated surface;Min assist         General transfer comment: Min A to come to full upright standing and for controlled descent  Ambulation/Gait Ambulation/Gait assistance: Min guard Gait Distance (Feet): 1 Feet Assistive device: Rolling walker (2 wheeled) Gait Pattern/deviations: Step-to pattern;Trunk  flexed Gait velocity: decreased   General Gait Details: Pt only able to take 2-3 very small, shuffling steps at the EOB with heavy trunk flexion and lean on the RW for support  Stairs            Wheelchair Mobility    Modified Rankin (Stroke Patients Only)       Balance Overall balance assessment: Needs assistance;History of Falls   Sitting balance-Leahy Scale: Good     Standing balance support: Bilateral upper extremity supported;During functional activity Standing balance-Leahy Scale: Poor Standing balance comment: Min A for stability in standing                             Pertinent Vitals/Pain Pain Assessment: No/denies pain    Home Living Family/patient expects to be discharged to:: Assisted living               Home Equipment: Walker - 2 wheels;Walker - 4 wheels      Prior Function Level of Independence: Independent with assistive device(s)         Comments: Mod Ind amb in facility at [facility] with a rollator, 3 falls in the last several months secondary to "legs give out from under me"; ALF staff assists with meds and meals, pt Ind with bathing and dressing     Hand Dominance        Extremity/Trunk Assessment   Upper Extremity Assessment Upper Extremity Assessment: Generalized weakness    Lower Extremity Assessment Lower Extremity Assessment: Generalized weakness  Communication   Communication: HOH  Cognition Arousal/Alertness: Awake/alert Behavior During Therapy: WFL for tasks assessed/performed Overall Cognitive Status: Within Functional Limits for tasks assessed                                        General Comments      Exercises Total Joint Exercises Ankle Circles/Pumps: AROM;Strengthening;Both;10 reps Quad Sets: 10 reps;Both;Strengthening Gluteal Sets: Strengthening;Both;10 reps Heel Slides: Strengthening;Both;5 reps Hip ABduction/ADduction: Strengthening;Both;5 reps Straight Leg  Raises: Strengthening;Both;5 reps Other Exercises Other Exercises: HEP education for BLE APs, QS, GS, and hip abd/add x 10 2-3x/day   Assessment/Plan    PT Assessment Patient needs continued PT services  PT Problem List Decreased strength;Decreased activity tolerance;Decreased balance;Decreased mobility;Decreased knowledge of use of DME       PT Treatment Interventions DME instruction;Gait training;Functional mobility training;Therapeutic exercise;Balance training;Patient/family education;Therapeutic activities    PT Goals (Current goals can be found in the Care Plan section)  Acute Rehab PT Goals Patient Stated Goal: To be able to walk without falling PT Goal Formulation: With patient Time For Goal Achievement: 01/22/21 Potential to Achieve Goals: Good    Frequency Min 2X/week   Barriers to discharge Decreased caregiver support      Co-evaluation               AM-PAC PT "6 Clicks" Mobility  Outcome Measure Help needed turning from your back to your side while in a flat bed without using bedrails?: A Little Help needed moving from lying on your back to sitting on the side of a flat bed without using bedrails?: A Little Help needed moving to and from a bed to a chair (including a wheelchair)?: A Little Help needed standing up from a chair using your arms (e.g., wheelchair or bedside chair)?: A Little Help needed to walk in hospital room?: Total Help needed climbing 3-5 steps with a railing? : Total 6 Click Score: 14    End of Session Equipment Utilized During Treatment: Gait belt Activity Tolerance: Patient tolerated treatment well Patient left: in bed;with call bell/phone within reach Nurse Communication: Mobility status PT Visit Diagnosis: Unsteadiness on feet (R26.81);History of falling (Z91.81);Muscle weakness (generalized) (M62.81);Difficulty in walking, not elsewhere classified (R26.2)    Time: 5284-1324 PT Time Calculation (min) (ACUTE ONLY): 33  min   Charges:   PT Evaluation $PT Eval Moderate Complexity: 1 Mod PT Treatments $Therapeutic Exercise: 8-22 mins        D. Scott Jheremy Boger PT, DPT 01/09/21, 11:31 AM

## 2021-01-09 NOTE — Progress Notes (Signed)
[facility]  - [place] [facility]   PATIENT NAME: [name] [name]    MR#:  [id number]  DATE OF BIRTH:  [birth year]  SUBJECTIVE:   no family in the ER. Spoke with daughter on the phone. Patient apparently came in from [facility] facility after he had fallen three times since yesterday.  Patient denies any complaints. Per RN some mild confusion  REVIEW OF SYSTEMS:   Review of Systems  Unable to perform ROS: Mental acuity  Tolerating Diet: Tolerating PT:   DRUG ALLERGIES:  No Known Allergies  VITALS:  Blood pressure 136/64, pulse (!) 30, temperature 98.2 F (36.8 C), temperature source Oral, resp. rate 16, height 5\' 8"  (1.727 m), weight 84.2 kg, SpO2 95 %.  PHYSICAL EXAMINATION:   Physical Exam  GENERAL:  87 y.o.-year-old patient lying in the bed with no acute distress.  LUNGS: Normal breath sounds bilaterally, no wheezing, rales, rhonchi. No use of accessory muscles of respiration.  CARDIOVASCULAR: S1, S2 normal. No murmurs, rubs, or gallops.  ABDOMEN: Soft, nontender, nondistended. Bowel sounds present. No organomegaly or mass.  EXTREMITIES: No cyanosis, clubbing or edema b/l.    NEUROLOGIC: no focal deficit- was all extremities well PSYCHIATRIC:  patient is alert and alert.  SKIN: bruises + over UE  LABORATORY PANEL:  CBC Recent Labs  Lab 01/08/21 2115  WBC 6.4  HGB 12.6*  HCT 36.4*  PLT 154    Chemistries  Recent Labs  Lab 01/09/21 0729  NA 133*  K 4.2  CL 99  CO2 26  GLUCOSE 95  BUN 26*  CREATININE 0.99  CALCIUM 9.0  AST 73*  ALT 31  ALKPHOS 35*  BILITOT 1.3*   Cardiac Enzymes No results for input(s): TROPONINI in the last 168 hours. RADIOLOGY:  DG Chest 2 View  Result Date: 01/08/2021 CLINICAL DATA:  Weakness.  Three falls since yesterday. EXAM: CHEST - 2 VIEW COMPARISON:  None. FINDINGS: The heart is upper normal in size. Aortic atherosclerosis and tortuosity. 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 Mild adjacent right lung base atelectasis. No pneumothorax, pleural effusion, confluent airspace disease or pulmonary edema. Bones are diffusely under mineralized. Possible left lateral ninth rib fracture, incompletely included in the field of view, and of unknown acuity. IMPRESSION: 1. Possible left lateral ninth rib fracture, of unknown acuity. Recommend correlation with point tenderness. 2. No other acute findings. 3. Aortic atherosclerosis and tortuosity. Mitral annulus calcifications. Electronically Signed   By: 03/10/2021 M.D.   On: 01/08/2021 20:23   CT Head Wo Contrast  Result Date: 01/08/2021 CLINICAL DATA:  Head trauma EXAM: CT HEAD WITHOUT CONTRAST CT CERVICAL SPINE WITHOUT CONTRAST TECHNIQUE: Multidetector CT imaging of the head and cervical spine was performed following the standard protocol without intravenous contrast. Multiplanar CT image reconstructions of the cervical spine were also generated. COMPARISON:  None. FINDINGS: CT HEAD FINDINGS Brain: No acute territorial infarction, hemorrhage or intracranial mass. Moderate atrophy. Moderate hypodensity in the white matter consistent with chronic small vessel ischemic change. Chronic appearing lacunar infarct in the left basal ganglia. Nonenlarged ventricles Vascular: No hyperdense vessels.  Carotid vascular calcification Skull: Normal. Negative for fracture or focal lesion. Sinuses/Orbits: No acute finding. Other: None CT CERVICAL SPINE FINDINGS Alignment: Generalized straightening. 6 mm anterolisthesis C4 on C5. Facet alignment is maintained. Skull base and vertebrae: No acute fracture. No primary bone lesion or focal pathologic process. Soft tissues and spinal canal: No prevertebral fluid or swelling. No  visible canal hematoma. Disc levels: Advanced degenerative changes throughout the cervical spine with multiple level disc space narrowing osteophyte. Hypertrophic facet degenerative  changes at multiple levels with foraminal narrowing. Upper chest: Apical scarring with calcifications on the right. Other: None IMPRESSION: 1. No CT evidence for acute intracranial abnormality. Atrophy and chronic small vessel ischemic changes of the white matter. 2. Straightening of the cervical spine with 6 mm anterolisthesis C4 on C5. Advanced degenerative changes throughout the cervical spine. No acute fracture is seen Electronically Signed   By: Jasmine Pang M.D.   On: 01/08/2021 21:19   CT Cervical Spine Wo Contrast  Result Date: 01/08/2021 CLINICAL DATA:  Head trauma EXAM: CT HEAD WITHOUT CONTRAST CT CERVICAL SPINE WITHOUT CONTRAST TECHNIQUE: Multidetector CT imaging of the head and cervical spine was performed following the standard protocol without intravenous contrast. Multiplanar CT image reconstructions of the cervical spine were also generated. COMPARISON:  None. FINDINGS: CT HEAD FINDINGS Brain: No acute territorial infarction, hemorrhage or intracranial mass. Moderate atrophy. Moderate hypodensity in the white matter consistent with chronic small vessel ischemic change. Chronic appearing lacunar infarct in the left basal ganglia. Nonenlarged ventricles Vascular: No hyperdense vessels.  Carotid vascular calcification Skull: Normal. Negative for fracture or focal lesion. Sinuses/Orbits: No acute finding. Other: None CT CERVICAL SPINE FINDINGS Alignment: Generalized straightening. 6 mm anterolisthesis C4 on C5. Facet alignment is maintained. Skull base and vertebrae: No acute fracture. No primary bone lesion or focal pathologic process. Soft tissues and spinal canal: No prevertebral fluid or swelling. No visible canal hematoma. Disc levels: Advanced degenerative changes throughout the cervical spine with multiple level disc space narrowing osteophyte. Hypertrophic facet degenerative changes at multiple levels with foraminal narrowing. Upper chest: Apical scarring with calcifications on the right.  Other: None IMPRESSION: 1. No CT evidence for acute intracranial abnormality. Atrophy and chronic small vessel ischemic changes of the white matter. 2. Straightening of the cervical spine with 6 mm anterolisthesis C4 on C5. Advanced degenerative changes throughout the cervical spine. No acute fracture is seen Electronically Signed   By: Jasmine Pang M.D.   On: 01/08/2021 21:19   ASSESSMENT AND PLAN:  [name] [name] is a 87 y.o. male with medical history significant for hypothyroidism, osteoarthritis, GERD and depression who presents with concerns of multiple falls from SNF.  Patient reports that he had 3 falls in the past 24 hours.    Elevated troponin -Patient currently only complaining of weakness and no chest pain or dyspnea - Initial troponin of 781--548. -- cardiology consultation with Dr. Lennette Bihari  Patient denies any chest pain.. Recommends aspirin and statins. No further workup  Sinus bradycardia - Asymptomatic at this time -- heart rate 50--79   Recurrent falls --Chest x-ray showed left lateral ninth rib fracture of unknown acuity in Negative CT head. CT of the cervical spine with no fracture -- PT to see patient -- continue telemonitoring  AKI -creatinine of 1.29 with no prior for comparison - has received IV fluid resuscitation.  -- Repeat labs shows improvement in creatinine after IV fluid   Left lateral ninth rib fracture -No significant pain   DVT prophylaxis:.Lovenox Code Status: Full Family Communication: Plan discussed with daughter on the phone disposition Plan: TBD Consults called: Cardiology Admission status: Observation    Status is: Observation   The patient remains OBS appropriate and will d/c before 2 midnights.   Dispo: The patient is from: SNF              Anticipated d/c  is to: SNF              Patient currently is not medically stable to d/c.              Difficult to place patient No        TOTAL TIME TAKING CARE OF THIS PATIENT: 25 minutes.   >50% time spent on counselling and coordination of care  Note: This dictation was prepared with Dragon dictation along with smaller phrase technology. Any transcriptional errors that result from this process are unintentional.  Enedina Finner M.D    [facility]   CC: Primary care physician; System, Provider Not In Patient ID: [name] [name], male   DOB: 05-Dec-1924, 87 y.o.   MRN: [id number]

## 2021-01-10 ENCOUNTER — Encounter: Payer: Self-pay | Admitting: Family Medicine

## 2021-01-10 DIAGNOSIS — L8990 Pressure ulcer of unspecified site, unspecified stage: Secondary | ICD-10-CM | POA: Insufficient documentation

## 2021-01-10 DIAGNOSIS — R778 Other specified abnormalities of plasma proteins: Secondary | ICD-10-CM | POA: Diagnosis not present

## 2021-01-10 LAB — ECHOCARDIOGRAM COMPLETE
Area-P 1/2: 1.6 cm2
Height: 68 in
P 1/2 time: 348 msec
S' Lateral: 5 cm
Weight: 2969.6 oz

## 2021-01-10 MED ORDER — CLOPIDOGREL BISULFATE 75 MG PO TABS
75.0000 mg | ORAL_TABLET | Freq: Every day | ORAL | Status: DC
  Administered 2021-01-10: 75 mg via ORAL
  Filled 2021-01-10: qty 1

## 2021-01-10 MED ORDER — CLOPIDOGREL BISULFATE 75 MG PO TABS: 75.0000 mg | ORAL_TABLET | Freq: Every day | ORAL | 0 refills | Status: DC

## 2021-01-10 NOTE — NC FL2 (Signed)
  [place] MEDICAID FL2 LEVEL OF CARE SCREENING TOOL     IDENTIFICATION  Patient Name: [name] [name] Birthdate: [birth year] Sex: male Admission Date (Current Location): 01/08/2021  [facility] and [place] Number:  Chiropodist and Address:  [facility], [address], [place], [place] [address]      Provider Number: [id number]  Attending Physician Name and Address:  Enedina Finner, MD  Relative Name and Phone Number:       Current Level of Care: Hospital Recommended Level of Care: Skilled Nursing Facility Prior Approval Number:    Date Approved/Denied:   PASRR Number: [id number]  Discharge Plan: SNF    Current Diagnoses: Patient Active Problem List   Diagnosis Date Noted   Pressure injury of skin 01/10/2021   Sinus bradycardia 01/09/2021   Multiple falls 01/09/2021   AKI (acute kidney injury) (HCC) 01/09/2021   Rib fracture 01/09/2021   Elevated troponin 01/08/2021    Orientation RESPIRATION BLADDER Height & Weight     Self  Normal Incontinent Weight: 185 lb 9.6 oz (84.2 kg) Height:  5\' 8"  (172.7 cm)  BEHAVIORAL SYMPTOMS/MOOD NEUROLOGICAL BOWEL NUTRITION STATUS      Continent Diet (regular diet)  AMBULATORY STATUS COMMUNICATION OF NEEDS Skin   Limited Assist Verbally PU Stage and Appropriate Care   PU Stage 2 Dressing:  (sacrum)                   Personal Care Assistance Level of Assistance  Bathing, Feeding, Dressing Bathing Assistance: Limited assistance Feeding assistance: Independent Dressing Assistance: Limited assistance     Functional Limitations Info  Sight, Hearing, Speech Sight Info: Adequate Hearing Info: Adequate Speech Info: Adequate    SPECIAL CARE FACTORS FREQUENCY  PT (By licensed PT), OT (By licensed OT)     PT Frequency: 5x OT Frequency: 5x            Contractures Contractures Info: Not present    Additional Factors Info  Code Status, Allergies Code Status Info: DNR Allergies Info: no known  allergies           Current Medications (01/10/2021):  This is the current hospital active medication list Current Facility-Administered Medications  Medication Dose Route Frequency Provider Last Rate Last Admin   aspirin EC tablet 81 mg  81 mg Oral Daily 03/12/2021, MD   81 mg at 01/09/21 1044   cholecalciferol (VITAMIN D3) tablet 1,000 Units  1,000 Units Oral Daily 03/11/21, MD   1,000 Units at 01/09/21 1045   docusate sodium (COLACE) capsule 100 mg  100 mg Oral Daily 03/11/21, MD   100 mg at 01/09/21 1044   ferrous sulfate tablet 325 mg  325 mg Oral Q1200 03/11/21, MD   325 mg at 01/09/21 1400   levothyroxine (SYNTHROID) tablet 50 mcg  50 mcg Oral Q0600 03/11/21, MD   50 mcg at 01/10/21 0526   pantoprazole (PROTONIX) EC tablet 40 mg  40 mg Oral Daily 03/12/21, MD   40 mg at 01/09/21 1044     Discharge Medications: Please see discharge summary for a list of discharge medications.  Relevant Imaging Results:  Relevant Lab Results:   Additional Information SSN:[id number]  10-19-1971, LCSW

## 2021-01-10 NOTE — TOC Transition Note (Signed)
Transition of Care [facility]) - CM/SW Discharge Note   Patient Details  Name: [name] [name] MRN: [id number] Date of Birth: December 17, 1924  [facility] CM/SW Contact:  Maree Krabbe, LCSW Phone Number: 01/10/2021, 12:56 PM   Clinical Narrative:   Clinical Social Worker facilitated patient discharge including contacting patient family and facility to confirm patient discharge plans.  Clinical information faxed to facility and family agreeable with plan.  CSW arranged ambulance transport via [facility] to [facility] (room 701) .  RN to call [phone] for report prior to discharge.      Final next level of care: Skilled Nursing Facility Barriers to Discharge: No Barriers Identified   Patient Goals and CMS Choice Patient states their goals for this hospitalization and ongoing recovery are:: for pt to get better   Choice offered to / list presented to : Adult Children  Discharge Placement              Patient chooses bed at:  ([facility]) Patient to be transferred to facility by: [facility] Name of family member notified: Son Patient and family notified of of transfer: 01/10/21  Discharge Plan and Services In-house Referral: Clinical Social Work   Post Acute Care Choice: Skilled Nursing Facility                               Social Determinants of Health (SDOH) Interventions     Readmission Risk Interventions No flowsheet data found.

## 2021-01-10 NOTE — Consult Note (Addendum)
WOC Nurse Consult Note: Reason for Consult: Consult requested for buttocks and sacrum.  Pt states he previously had a pressure injury to the buttocks which has healed.  Left buttock with 2X2cm reddened intact skin consistent with scar tissue and buttocks with red moist skin and moisture associated skin damage. Upper middle sacrum with stage 2 pressure injury; 2X.2X.1cm, pink and moist Stage 2 pressure injury Pressure Injury POA: Yes Dressing procedure/placement/frequency: Topical treatment orders provided for bedside nurses to perform as follows to protect from further injury: Foam dressing to sacrum/buttocks, change Q 3 days or PRN soiling. Please re-consult if further assistance is needed.  Thank-you,  Cammie Mcgee MSN, RN, CWOCN, [place], CNS [phone]

## 2021-01-10 NOTE — Discharge Summary (Signed)
[facility] [address] [facility]   PATIENT NAME: [name] [name]    MR#:  [id number]  DATE OF BIRTH:  1925/02/08  DATE OF ADMISSION:  01/08/2021 ADMITTING PHYSICIAN: Anselm Jungling, DO  DATE OF DISCHARGE: 01/10/2021 multiple falls  PRIMARY CARE PHYSICIAN: System, Provider Not In    ADMISSION DIAGNOSIS:  Elevated troponin [R77.8] Multiple falls [R29.6] Altered mental status, unspecified altered mental status type [R41.82]  DISCHARGE DIAGNOSIS:  multiple falls-- nontraumatic  SECONDARY DIAGNOSIS:   Past Medical History:  Diagnosis Date   Constipation    Hypothyroid    Major depressive disorder    Osteoarthritis (arthritis due to wear and tear of joints)     HOSPITAL COURSE:  [name] [name] is a 87 y.o. male with medical history significant for hypothyroidism, osteoarthritis, GERD and depression who presents with concerns of multiple falls from SNF.  Patient reports that he had 3 falls in the past 24 hours.     Recurrent falls --Chest x-ray showed left lateral ninth rib fracture of unknown acuity in Negative CT head. CT of the cervical spine with no fracture -- PT to see patient -- continue tele-monitoring--remains SR 55-70  Elevated troponin -Patient currently only complaining of weakness and no chest pain or dyspnea - Initial troponin of 781--548. -- cardiology consultation with Dr. Lennette Bihari  Patient denies any chest pain.. Recommends aspirin and statins. No further workup at present --9/7--cardiology added plavix   Sinus bradycardia - Asymptomatic at this time -- heart rate 50--79    AKI -creatinine of 1.29 with no prior for comparison - has received IV fluid resuscitation.  -- Repeat labs shows improvement in creatinine after IV fluid --creat 0.99   Left lateral ninth rib fracture--?acute vs subacute -No significant pain   DVT prophylaxis:.Lovenox Code Status:DNR  Family Communication: Plan discussed with son on the phone disposition  Plan: To [facility] today Consults called: Cardiology Admission status: Observation    Status is: Observation        CONSULTS OBTAINED:  Treatment Team:  Laurier Nancy, MD  DRUG ALLERGIES:  No Known Allergies  DISCHARGE MEDICATIONS:   Allergies as of 01/10/2021   No Known Allergies      Medication List     TAKE these medications    acetaminophen 500 MG tablet Commonly known as: TYLENOL Take 1,000 mg by mouth at bedtime. For knee pain.   aspirin 81 MG EC tablet Take 81 mg by mouth daily.   cetirizine 10 MG tablet Commonly known as: ZYRTEC Take 10 mg by mouth daily. For allergies   cholecalciferol 10 MCG (400 UNIT) Tabs tablet Commonly known as: VITAMIN D3 Take 800 Units by mouth daily.   clopidogrel 75 MG tablet Commonly known as: PLAVIX Take 1 tablet (75 mg total) by mouth daily. Start taking on: January 11, 2021   docusate sodium 100 MG capsule Commonly known as: COLACE Take 100 mg by mouth daily.   ferrous sulfate 325 (65 FE) MG tablet Take 325 mg by mouth daily.   levothyroxine 50 MCG tablet Commonly known as: SYNTHROID Take 50 mcg by mouth daily. Take on an empty stomach with a glass of water at least 30 to 60 minutes before breakfast.   omeprazole 20 MG capsule Commonly known as: PRILOSEC Take 20 mg by mouth daily. In the morning.               Discharge Care Instructions  (From admission, onward)  Start     Ordered   01/10/21 0000  Discharge wound care:       Comments: 01/10/21 0848    Foam dressing  Until discontinued      Comments per WOC: Foam dressing to sacrum/buttocks, change Q 3 days or PRN soiling  01/10/21 0848   01/10/21 1148            If you experience worsening of your admission symptoms, develop shortness of breath, life threatening emergency, suicidal or homicidal thoughts you must seek medical attention immediately by calling 911 or calling your MD immediately  if symptoms less severe.  You Must read  complete instructions/literature along with all the possible adverse reactions/side effects for all the Medicines you take and that have been prescribed to you. Take any new Medicines after you have completely understood and accept all the possible adverse reactions/side effects.   Please note  You were cared for by a hospitalist during your hospital stay. If you have any questions about your discharge medications or the care you received while you were in the hospital after you are discharged, you can call the unit and asked to speak with the hospitalist on call if the hospitalist that took care of you is not available. Once you are discharged, your primary care physician will handle any further medical issues. Please note that NO REFILLS for any discharge medications will be authorized once you are discharged, as it is imperative that you return to your primary care physician (or establish a relationship with a primary care physician if you do not have one) for your aftercare needs so that they can reassess your need for medications and monitor your lab values. Today   SUBJECTIVE  No new complaints. Has some pleasant confusion   VITAL SIGNS:  Blood pressure 129/72, pulse 61, temperature 98 F (36.7 C), resp. rate 18, height  (1.727 m), weight 84.2 kg, SpO2 92 %.  I/O:   Intake/Output Summary (Last 24 hours) at 01/10/2021 1149 Last data filed at 01/10/2021 0409 Gross per 24 hour  Intake --  Output 600 ml  Net -600 ml    PHYSICAL EXAMINATION:  GENERAL:  87 y.o.-year-old patient lying in the bed with no acute distress.  LUNGS: Normal breath sounds bilaterally, no wheezing, rales,rhonchi or crepitation. No use of accessory muscles of respiration.  CARDIOVASCULAR: S1, S2 normal. No murmurs, rubs, or gallops.  ABDOMEN: Soft, non-tender, non-distended. Bowel sounds present. No organomegaly or mass.  EXTREMITIES: No pedal edema, cyanosis, or clubbing.  NEUROLOGIC:non focal PSYCHIATRIC: The  patient is alert and awake SKIN:  Pressure Injury 01/09/21 Sacrum Mid Stage 2 -  Partial thickness loss of dermis presenting as a shallow open injury with a red, pink wound bed without slough. (Active)  01/09/21 1702  Location: Sacrum  Location Orientation: Mid  Staging: Stage 2 -  Partial thickness loss of dermis presenting as a shallow open injury with a red, pink wound bed without slough.  Wound Description (Comments):   Present on Admission: Yes        DATA REVIEW:   CBC  Recent Labs  Lab 01/08/21 2115  WBC 6.4  HGB 12.6*  HCT 36.4*  PLT 154    Chemistries  Recent Labs  Lab 01/09/21 0729  NA 133*  K 4.2  CL 99  CO2 26  GLUCOSE 95  BUN 26*  CREATININE 0.99  CALCIUM 9.0  AST 73*  ALT 31  ALKPHOS 35*  BILITOT 1.3*  Microbiology Results   Recent Results (from the past 240 hour(s))  Resp Panel by RT-PCR (Flu A&B, Covid) Nasopharyngeal Swab     Status: None   Collection Time: 01/08/21  9:15 PM   Specimen: Nasopharyngeal Swab; Nasopharyngeal(NP) swabs in vial transport medium  Result Value Ref Range Status   SARS Coronavirus 2 by RT PCR NEGATIVE NEGATIVE Final    Comment: (NOTE) SARS-CoV-2 target nucleic acids are NOT DETECTED.  The SARS-CoV-2 RNA is generally detectable in upper respiratory specimens during the acute phase of infection. The lowest concentration of SARS-CoV-2 viral copies this assay can detect is 138 copies/mL. A negative result does not preclude SARS-Cov-2 infection and should not be used as the sole basis for treatment or other patient management decisions. A negative result may occur with  improper specimen collection/handling, submission of specimen other than nasopharyngeal swab, presence of viral mutation(s) within the areas targeted by this assay, and inadequate number of viral copies(<138 copies/mL). A negative result must be combined with clinical observations, patient history, and epidemiological information. The expected  result is Negative.  Fact Sheet for Patients:  BloggerCourse.comhttps://www.fda.gov/media/152166/download  Fact Sheet for Healthcare Providers:  SeriousBroker.ithttps://www.fda.gov/media/152162/download  This test is no t yet approved or cleared by the [place] FDA and  has been authorized for detection and/or diagnosis of SARS-CoV-2 by FDA under an Emergency Use Authorization (EUA). This EUA will remain  in effect (meaning this test can be used) for the duration of the COVID-19 declaration under Section 564(b)(1) of the Act, 21 U.S.C.section 360bbb-3(b)(1), unless the authorization is terminated  or revoked sooner.       Influenza A by PCR NEGATIVE NEGATIVE Final   Influenza B by PCR NEGATIVE NEGATIVE Final    Comment: (NOTE) The Xpert Xpress SARS-CoV-2/FLU/RSV plus assay is intended as an aid in the diagnosis of influenza from Nasopharyngeal swab specimens and should not be used as a sole basis for treatment. Nasal washings and aspirates are unacceptable for Xpert Xpress SARS-CoV-2/FLU/RSV testing.  Fact Sheet for Patients: BloggerCourse.comhttps://www.fda.gov/media/152166/download  Fact Sheet for Healthcare Providers: SeriousBroker.ithttps://www.fda.gov/media/152162/download  This test is not yet approved or cleared by the [place] FDA and has been authorized for detection and/or diagnosis of SARS-CoV-2 by FDA under an Emergency Use Authorization (EUA). This EUA will remain in effect (meaning this test can be used) for the duration of the COVID-19 declaration under Section 564(b)(1) of the Act, 21 U.S.C. section 360bbb-3(b)(1), unless the authorization is terminated or revoked.  Performed at [facility], [address] [place], [place] [address]     RADIOLOGY:  DG Chest 2 View  Result Date: 01/08/2021 CLINICAL DATA:  Weakness.  Three falls since yesterday. EXAM: CHEST - 2 VIEW COMPARISON:  None. FINDINGS: The heart is upper normal in size. Aortic atherosclerosis and tortuosity. 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 Mild adjacent right lung base atelectasis. No pneumothorax, pleural effusion, confluent airspace disease or pulmonary edema. Bones are diffusely under mineralized. Possible left lateral ninth rib fracture, incompletely included in the field of view, and of unknown acuity. IMPRESSION: 1. Possible left lateral ninth rib fracture, of unknown acuity. Recommend correlation with point tenderness. 2. No other acute findings. 3. Aortic atherosclerosis and tortuosity. Mitral annulus calcifications. Electronically Signed   By: Narda RutherfordMelanie  Sanford M.D.   On: 01/08/2021 20:23   CT Head Wo Contrast  Result Date: 01/08/2021 CLINICAL DATA:  Head trauma EXAM: CT HEAD WITHOUT CONTRAST CT CERVICAL SPINE WITHOUT CONTRAST TECHNIQUE:  Multidetector CT imaging of the head and cervical spine was performed following the standard protocol without intravenous contrast. Multiplanar CT image reconstructions of the cervical spine were also generated. COMPARISON:  None. FINDINGS: CT HEAD FINDINGS Brain: No acute territorial infarction, hemorrhage or intracranial mass. Moderate atrophy. Moderate hypodensity in the white matter consistent with chronic small vessel ischemic change. Chronic appearing lacunar infarct in the left basal ganglia. Nonenlarged ventricles Vascular: No hyperdense vessels.  Carotid vascular calcification Skull: Normal. Negative for fracture or focal lesion. Sinuses/Orbits: No acute finding. Other: None CT CERVICAL SPINE FINDINGS Alignment: Generalized straightening. 6 mm anterolisthesis C4 on C5. Facet alignment is maintained. Skull base and vertebrae: No acute fracture. No primary bone lesion or focal pathologic process. Soft tissues and spinal canal: No prevertebral fluid or swelling. No visible canal hematoma. Disc levels: Advanced degenerative changes throughout the cervical spine with multiple level disc space narrowing osteophyte.  Hypertrophic facet degenerative changes at multiple levels with foraminal narrowing. Upper chest: Apical scarring with calcifications on the right. Other: None IMPRESSION: 1. No CT evidence for acute intracranial abnormality. Atrophy and chronic small vessel ischemic changes of the white matter. 2. Straightening of the cervical spine with 6 mm anterolisthesis C4 on C5. Advanced degenerative changes throughout the cervical spine. No acute fracture is seen Electronically Signed   By: Jasmine Pang M.D.   On: 01/08/2021 21:19   CT Cervical Spine Wo Contrast  Result Date: 01/08/2021 CLINICAL DATA:  Head trauma EXAM: CT HEAD WITHOUT CONTRAST CT CERVICAL SPINE WITHOUT CONTRAST TECHNIQUE: Multidetector CT imaging of the head and cervical spine was performed following the standard protocol without intravenous contrast. Multiplanar CT image reconstructions of the cervical spine were also generated. COMPARISON:  None. FINDINGS: CT HEAD FINDINGS Brain: No acute territorial infarction, hemorrhage or intracranial mass. Moderate atrophy. Moderate hypodensity in the white matter consistent with chronic small vessel ischemic change. Chronic appearing lacunar infarct in the left basal ganglia. Nonenlarged ventricles Vascular: No hyperdense vessels.  Carotid vascular calcification Skull: Normal. Negative for fracture or focal lesion. Sinuses/Orbits: No acute finding. Other: None CT CERVICAL SPINE FINDINGS Alignment: Generalized straightening. 6 mm anterolisthesis C4 on C5. Facet alignment is maintained. Skull base and vertebrae: No acute fracture. No primary bone lesion or focal pathologic process. Soft tissues and spinal canal: No prevertebral fluid or swelling. No visible canal hematoma. Disc levels: Advanced degenerative changes throughout the cervical spine with multiple level disc space narrowing osteophyte. Hypertrophic facet degenerative changes at multiple levels with foraminal narrowing. Upper chest: Apical scarring with  calcifications on the right. Other: None IMPRESSION: 1. No CT evidence for acute intracranial abnormality. Atrophy and chronic small vessel ischemic changes of the white matter. 2. Straightening of the cervical spine with 6 mm anterolisthesis C4 on C5. Advanced degenerative changes throughout the cervical spine. No acute fracture is seen Electronically Signed   By: Jasmine Pang M.D.   On: 01/08/2021 21:19   ECHOCARDIOGRAM COMPLETE  Result Date: 01/10/2021    ECHOCARDIOGRAM REPORT   Patient Name:   [name] [name] Date of Exam: 01/09/2021 Medical Rec #:  [id number]    Height:       68.0 in Accession #:    [id number]   Weight:       185.6 lb Date of Birth:  03-11-1925    BSA:          1.980 m Patient Age:    89 years     BP:  118/66 mmHg Patient Gender: M            HR:           65 bpm. Exam Location:  [facility] Procedure: 2D Echo, Cardiac Doppler and Color Doppler Indications:     Elevated Troponin  History:         Patient has no prior history of Echocardiogram examinations.                  Hypothyroidism.  Sonographer:     Daphine Deutscher RDCS Referring Phys:  [id number] [facility] Diagnosing Phys: Adrian Blackwater IMPRESSIONS  1. Left ventricular ejection fraction, by estimation, is <20%. The left ventricle has severely decreased function. The left ventricle demonstrates global hypokinesis. Left ventricular diastolic parameters are consistent with Grade III diastolic dysfunction (restrictive).  2. Right ventricular systolic function is normal. The right ventricular size is severely enlarged.  3. Left atrial size was severely dilated.  4. Right atrial size was severely dilated.  5. The mitral valve is abnormal. No evidence of mitral valve regurgitation. Moderate mitral stenosis.  6. Tricuspid valve regurgitation is moderate.  7. The aortic valve is grossly normal. Aortic valve regurgitation is moderate. No aortic stenosis is present.  8. The inferior vena cava is normal in size with greater than 50% respiratory  variability, suggesting right atrial pressure of 3 mmHg. Conclusion(s)/Recommendation(s): Findings consistent with ischemic cardiomyopathy. FINDINGS  Left Ventricle: Left ventricular ejection fraction, by estimation, is <20%. The left ventricle has severely decreased function. The left ventricle demonstrates global hypokinesis. The left ventricular internal cavity size was normal in size. There is no  left ventricular hypertrophy. Left ventricular diastolic parameters are consistent with Grade III diastolic dysfunction (restrictive). Right Ventricle: The right ventricular size is severely enlarged. No increase in right ventricular wall thickness. Right ventricular systolic function is normal. Left Atrium: Left atrial size was severely dilated. Right Atrium: Right atrial size was severely dilated. Pericardium: There is no evidence of pericardial effusion. Mitral Valve: The mitral valve is abnormal. No evidence of mitral valve regurgitation. Moderate mitral valve stenosis. Tricuspid Valve: The tricuspid valve is normal in structure. Tricuspid valve regurgitation is moderate . No evidence of tricuspid stenosis. Aortic Valve: The aortic valve is grossly normal. Aortic valve regurgitation is moderate. Aortic regurgitation PHT measures 348 msec. No aortic stenosis is present. Pulmonic Valve: The pulmonic valve was normal in structure. Pulmonic valve regurgitation is not visualized. No evidence of pulmonic stenosis. Aorta: The aortic root is normal in size and structure. Venous: The inferior vena cava is normal in size with greater than 50% respiratory variability, suggesting right atrial pressure of 3 mmHg. IAS/Shunts: No atrial level shunt detected by color flow Doppler.  LEFT VENTRICLE PLAX 2D LVIDd:         5.20 cm  Diastology LVIDs:         5.00 cm  LV e' medial:    4.68 cm/s LV PW:         1.20 cm  LV E/e' medial:  14.2 LV IVS:        1.20 cm  LV e' lateral:   3.81 cm/s LVOT diam:     2.10 cm  LV E/e' lateral: 17.5  LV SV:         38 LV SV Index:   19 LVOT Area:     3.46 cm  RIGHT VENTRICLE             IVC RV Basal diam:  4.80  cm     IVC diam: 2.10 cm RV S prime:     11.32 cm/s TAPSE (M-mode): 1.9 cm LEFT ATRIUM             Index       RIGHT ATRIUM           Index LA diam:        3.80 cm 1.92 cm/m  RA Area:     16.30 cm LA Vol (A2C):   84.4 ml 42.63 ml/m RA Volume:   52.30 ml  26.42 ml/m LA Vol (A4C):   74.2 ml 37.48 ml/m LA Biplane Vol: 80.5 ml 40.66 ml/m  AORTIC VALVE LVOT Vmax:   52.80 cm/s LVOT Vmean:  41.150 cm/s LVOT VTI:    0.111 m AI PHT:      348 msec  AORTA Ao Root diam: 3.90 cm Ao Asc diam:  3.50 cm MITRAL VALVE MV Area (PHT): 1.60 cm    SHUNTS MV Decel Time: 475 msec    Systemic VTI:  0.11 m MV E velocity: 66.50 cm/s  Systemic Diam: 2.10 cm MV A velocity: 53.40 cm/s MV E/A ratio:  1.25 Shaukat Khan Electronically signed by Adrian Blackwater Signature Date/Time: 01/10/2021/9:30:20 AM    Final      CODE STATUS:     Code Status Orders  (From admission, onward)           Start     Ordered   01/09/21 0023  Do not attempt resuscitation (DNR)  Continuous       Question Answer Comment  In the event of cardiac or respiratory ARREST Do not call a "code blue"   In the event of cardiac or respiratory ARREST Do not perform Intubation, CPR, defibrillation or ACLS   In the event of cardiac or respiratory ARREST Use medication by any route, position, wound care, and other measures to relive pain and suffering. May use oxygen, suction and manual treatment of airway obstruction as needed for comfort.      01/09/21 0024           Code Status History     This patient has a current code status but no historical code status.        TOTAL TIME TAKING CARE OF THIS PATIENT: 40 minutes.    Enedina Finner M.D  [facility]    CC: Primary care physician; System, Provider Not In

## 2021-01-10 NOTE — TOC Initial Note (Signed)
Transition of Care [facility]) - Initial/Assessment Note    Patient Details  Name: [name] [name] MRN: [id number] Date of Birth: 1924-08-18  [facility] CM/SW Contact:    Maree Krabbe, LCSW Phone Number: 01/10/2021, 9:28 AM  Clinical Narrative:  CSW spoke with pt's son. Pt is from [facility] however PT is recommending SNF. Pt's son states he would prefer pt go to [facility]. CSW has sent referral.              Expected Discharge Plan: Skilled Nursing Facility Barriers to Discharge: Continued Medical Work up   Patient Goals and CMS Choice Patient states their goals for this hospitalization and ongoing recovery are:: for pt to get better   Choice offered to / list presented to : Adult Children  Expected Discharge Plan and Services Expected Discharge Plan: Skilled Nursing Facility In-house Referral: Clinical Social Work   Post Acute Care Choice: Skilled Nursing Facility Living arrangements for the past 2 months: Assisted Living Facility                                      Prior Living Arrangements/Services Living arrangements for the past 2 months: Assisted Living Facility Lives with:: Self Patient language and need for interpreter reviewed:: Yes Do you feel safe going back to the place where you live?: Yes      Need for Family Participation in Patient Care: Yes (Comment) Care giver support system in place?: Yes (comment)   Criminal Activity/Legal Involvement Pertinent to Current Situation/Hospitalization: No - Comment as needed  Activities of Daily Living Home Assistive Devices/Equipment: Dan Humphreys (specify type) ADL Screening (condition at time of admission) Patient's cognitive ability adequate to safely complete daily activities?: No Is the patient deaf or have difficulty hearing?: Yes Does the patient have difficulty seeing, even when wearing glasses/contacts?: No Does the patient have difficulty concentrating, remembering, or making decisions?: Yes Patient  able to express need for assistance with ADLs?: No Does the patient have difficulty dressing or bathing?: Yes Independently performs ADLs?: No Communication: Needs assistance Is this a change from baseline?: Pre-admission baseline Dressing (OT): Needs assistance Is this a change from baseline?: Pre-admission baseline Grooming: Needs assistance Is this a change from baseline?: Pre-admission baseline Feeding: Needs assistance Is this a change from baseline?: Pre-admission baseline Bathing: Needs assistance Is this a change from baseline?: Pre-admission baseline Toileting: Needs assistance Is this a change from baseline?: Pre-admission baseline In/Out Bed: Needs assistance Is this a change from baseline?: Pre-admission baseline Walks in Home: Needs assistance Is this a change from baseline?: Pre-admission baseline Does the patient have difficulty walking or climbing stairs?: Yes Weakness of Legs: Both Weakness of Arms/Hands: Both  Permission Sought/Granted Permission sought to share information with : Family Supports    Share Information with NAME: Fayrene Fearing  Permission granted to share info w AGENCY: [facility]  Permission granted to share info w Relationship: son     Emotional Assessment Appearance:: Appears stated age Attitude/Demeanor/Rapport: Unable to Assess Affect (typically observed): Unable to Assess Orientation: : Oriented to Self Alcohol / Substance Use: Not Applicable Psych Involvement: No (comment)  Admission diagnosis:  Elevated troponin [R77.8] Multiple falls [R29.6] Altered mental status, unspecified altered mental status type [R41.82] Patient Active Problem List   Diagnosis Date Noted   Pressure injury of skin 01/10/2021   Sinus bradycardia 01/09/2021   Multiple falls 01/09/2021   AKI (acute kidney injury) (HCC) 01/09/2021  Rib fracture 01/09/2021   Elevated troponin 01/08/2021   PCP:  System, Provider Not In Pharmacy:  No Pharmacies Listed    Social  Determinants of Health (SDOH) Interventions    Readmission Risk Interventions No flowsheet data found.

## 2021-01-10 NOTE — Discharge Instructions (Signed)
F/u with your PCP after discharge from rehab

## 2021-01-10 NOTE — Progress Notes (Addendum)
SUBJECTIVE: [name] [name] is a 87 y.o. male with medical history significant for hypothyroidism, osteoarthritis, GERD and depression who presents with concerns of multiple falls from SNF. Patient is a poor historian. Currently denies chest pain, shortness of breath, dizziness.    Vitals:   01/09/21 1645 01/09/21 1920 01/10/21 0034 01/10/21 0416  BP: (!) 131/46 120/72 139/72 (!) 145/80  Pulse: (!) 48 (!) 53 (!) 54 (!) 51  Resp: 18 18 18 18   Temp: (!) 97.5 F (36.4 C) 97.7 F (36.5 C) 97.6 F (36.4 C) (!) 97.5 F (36.4 C)  TempSrc:   Oral Oral  SpO2: 95% 97% 92% 99%  Weight:      Height:        Intake/Output Summary (Last 24 hours) at 01/10/2021 0858 Last data filed at 01/10/2021 0409 Gross per 24 hour  Intake --  Output 1425 ml  Net -1425 ml    LABS: Basic Metabolic Panel: Recent Labs    01/08/21 2115 01/09/21 0729  NA 130* 133*  K 4.2 4.2  CL 98 99  CO2 27 26  GLUCOSE 120* 95  BUN 31* 26*  CREATININE 1.29* 0.99  CALCIUM 9.1 9.0   Liver Function Tests: Recent Labs    01/08/21 2115 01/09/21 0729  AST 74* 73*  ALT 31 31  ALKPHOS 40 35*  BILITOT 1.1 1.3*  PROT 6.0* 6.1*  ALBUMIN 3.6 3.7   No results for input(s): LIPASE, AMYLASE in the last 72 hours. CBC: Recent Labs    01/08/21 2115  WBC 6.4  NEUTROABS 5.1  HGB 12.6*  HCT 36.4*  MCV 92.6  PLT 154   Cardiac Enzymes: No results for input(s): CKTOTAL, CKMB, CKMBINDEX, TROPONINI in the last 72 hours. BNP: Invalid input(s): POCBNP D-Dimer: No results for input(s): DDIMER in the last 72 hours. Hemoglobin A1C: No results for input(s): HGBA1C in the last 72 hours. Fasting Lipid Panel: No results for input(s): CHOL, HDL, LDLCALC, TRIG, CHOLHDL, LDLDIRECT in the last 72 hours. Thyroid Function Tests: Recent Labs    01/08/21 2115  TSH 2.170   Anemia Panel: No results for input(s): VITAMINB12, FOLATE, FERRITIN, TIBC, IRON, RETICCTPCT in the last 72 hours.   PHYSICAL EXAM General: Well developed, well  nourished, in no acute distress HEENT:  Normocephalic and atramatic Neck:  No JVD.  Lungs: Clear bilaterally to auscultation and percussion. Heart: HRRR . Normal S1 and S2 without gallops or murmurs.  Abdomen: Bowel sounds are positive, abdomen soft and non-tender  Msk:  Back normal, normal gait. Normal strength and tone for age. Extremities: No clubbing, cyanosis or edema.   Neuro: alert, oriented x3 Psych:  Good affect, responds appropriately  TELEMETRY: NSR, HR 52  ASSESSMENT AND PLAN: Patient continues to be asymptomatic. HR improved, sinus rhythm. Patient has dementia. Not a candidate for treatment due to bradycardia. Add Plavix 75 mg. Continue to observe.   Principal Problem:   Elevated troponin Active Problems:   Sinus bradycardia   Multiple falls   AKI (acute kidney injury) (HCC)   Rib fracture   Pressure injury of skin    Morgaine Kimball, FNP-C 01/10/2021 8:58 AM

## 2021-01-10 NOTE — Progress Notes (Signed)
Physical Therapy Treatment Patient Details Name: [name] [name] MRN: [id number] DOB: [birth year] Today's Date: 01/10/2021    History of Present Illness Pt is a 87 y.o. male with medical history significant for hypothyroidism, osteoarthritis, GERD and depression who presents with concerns of multiple falls from SNF. MD assessment includes: Elevated troponin, recurrent falls, AKI, and left 9th rib Fx with no significant pain.    PT Comments    Pt was pleasant and motivated to participate during the session and made good progress towards goals.  Pt was able to amb five feet twice with seated rest break between efforts and required no physical assistance to prevent LOB this session.  Pt reported no adverse symptoms during the session with SpO2 and HR WNL. Pt will benefit from PT services in a SNF setting upon discharge to safely address deficits listed in patient problem list for decreased caregiver assistance and eventual return to PLOF.      Follow Up Recommendations  SNF;Supervision/Assistance - 24 hour     Equipment Recommendations  None recommended by PT    Recommendations for Other Services       Precautions / Restrictions Precautions Precautions: Fall Restrictions Weight Bearing Restrictions: No    Mobility  Bed Mobility Overal bed mobility: Modified Independent             General bed mobility comments: Extra time and effort only    Transfers Overall transfer level: Needs assistance Equipment used: Rolling walker (2 wheeled) Transfers: Sit to/from Stand Sit to Stand: From elevated surface;Min assist         General transfer comment: Min A to come to full upright standing and for controlled descent  Ambulation/Gait Ambulation/Gait assistance: Min guard Gait Distance (Feet): 5 Feet x 2 Assistive device: Rolling walker (2 wheeled) Gait Pattern/deviations: Step-through pattern;Decreased step length - right;Decreased step length - left;Trunk flexed Gait velocity:  decreased   General Gait Details: Pt ambulated with very small, shuffling steps at the EOB with heavy trunk flexion and lean on the RW for support   Stairs             Wheelchair Mobility    Modified Rankin (Stroke Patients Only)       Balance Overall balance assessment: Needs assistance;History of Falls   Sitting balance-Leahy Scale: Good     Standing balance support: Bilateral upper extremity supported;During functional activity Standing balance-Leahy Scale: Fair Standing balance comment: Mod lean on the RW for support but no physical assist to prevent LOB this session                            Cognition Arousal/Alertness: Awake/alert Behavior During Therapy: WFL for tasks assessed/performed Overall Cognitive Status: Within Functional Limits for tasks assessed                                        Exercises Total Joint Exercises Ankle Circles/Pumps: AROM;Strengthening;Both;10 reps Quad Sets: 10 reps;Both;Strengthening;5 reps Gluteal Sets: Strengthening;Both;10 reps Hip ABduction/ADduction: Strengthening;Both;5 reps Long Arc Quad: AROM;Strengthening;Both;10 reps Other Exercises Other Exercises: HEP review per prior session with emphasis on QS and general positioning to promoted knee ext ROM    General Comments        Pertinent Vitals/Pain Pain Assessment: No/denies pain    Home Living  Prior Function            PT Goals (current goals can now be found in the care plan section) Progress towards PT goals: Progressing toward goals    Frequency    Min 2X/week      PT Plan Current plan remains appropriate    Co-evaluation              AM-PAC PT "6 Clicks" Mobility   Outcome Measure  Help needed turning from your back to your side while in a flat bed without using bedrails?: A Little Help needed moving from lying on your back to sitting on the side of a flat bed without using  bedrails?: A Little Help needed moving to and from a bed to a chair (including a wheelchair)?: A Little Help needed standing up from a chair using your arms (e.g., wheelchair or bedside chair)?: A Little Help needed to walk in hospital room?: Total Help needed climbing 3-5 steps with a railing? : Total 6 Click Score: 14    End of Session Equipment Utilized During Treatment: Gait belt Activity Tolerance: Patient tolerated treatment well Patient left: in chair;with call bell/phone within reach;with chair alarm set Nurse Communication: Mobility status PT Visit Diagnosis: Unsteadiness on feet (R26.81);History of falling (Z91.81);Muscle weakness (generalized) (M62.81);Difficulty in walking, not elsewhere classified (R26.2)     Time: 1026-1050 PT Time Calculation (min) (ACUTE ONLY): 24 min  Charges:  $Gait Training: 8-22 mins $Therapeutic Exercise: 8-22 mins                     D. Scott Deem Marmol PT, DPT 01/10/21, 1:30 PM

## 2021-01-24 ENCOUNTER — Other Ambulatory Visit: Payer: Self-pay

## 2021-01-24 ENCOUNTER — Non-Acute Institutional Stay: Payer: Medicare Other | Admitting: Primary Care

## 2021-01-24 DIAGNOSIS — S2232XA Fracture of one rib, left side, initial encounter for closed fracture: Secondary | ICD-10-CM

## 2021-01-24 DIAGNOSIS — Z515 Encounter for palliative care: Secondary | ICD-10-CM

## 2021-01-24 DIAGNOSIS — R296 Repeated falls: Secondary | ICD-10-CM

## 2021-01-24 DIAGNOSIS — R001 Bradycardia, unspecified: Secondary | ICD-10-CM

## 2021-01-24 NOTE — Progress Notes (Signed)
  [facility] Palliative Care Consult Note Telephone: [phone]  Fax: [phone]   Date of encounter: 01/24/21 3:13 PM PATIENT NAME: [name] [name] [address] [place] [address]   [phone] (home)  DOB: 05/18/1924 MRN: [id number] PRIMARY CARE PROVIDER:    Faith Castillo, FNP [facility] [place] REFERRING PROVIDER:   Faith Castillo, FNP [facility]   RESPONSIBLE PARTY:    Contact Information     Name Relation Home Work Mobile   Vizcarrondo,Margaret Daughter   [phone]   Manago,James Son   [phone]        I met face to face with [facility] at [facility]. Palliative Care was asked to follow this patient by consultation request of  Faith Castillo, FNP to address advance care planning and complex medical decision making. This is the initial visit.                                     ASSESSMENT AND PLAN / RECOMMENDATIONS:   Advance Care Planning/Goals of Care: Goals include to maximize quality of life and symptom management. Patient/health care surrogate Daughter E Margaret Longsworth gave his/her permission to discuss.Our advance care planning conversation included a discussion about:    The value and importance of advance care planning  Exploration of personal, cultural or spiritual beliefs that might influence medical decisions  Exploration of goals of care in the event of a sudden injury or illness  Identification of a healthcare agent  Both children Review of an  advance directive document . CODE STATUS: DNR, comfort measures, use of abx, use of iv, no feeding tube. Discussed goals of care which are comfort oriented and hospice once qualifications are met.  Daughter is caring for both parents, who are divorced, along with her brother. There is additional illness in the family, adding caregiver strain. We discussed a family meeting with patient,ALF staff and [facility] personnel to discuss ACP and goals for managing illness. Upon  returning to [facility] he will see a different [facility] NP.  Symptom Management/Plan:  I met with patient in his nursing home room. He endorsed being ready to go back to ALF to his room. [facility] was admitted to [facility] following debility and falls at his facility, via the hospital. He states he is ready to get back. He gave me permission to speak with his daughter as well.  Safety: She outlines his falls and HR of 40's at times, dx noted as sinus brady in one note. Pt has been able to ambulate with walker at ALF prior to this hospital stay and SNF admission, but has had more events with falling. They choose not to pursue cardiac devices or address with more than addressing fall prevention. To that end he is mainly in the w/c now.  Pain: Daughter outlines pt not wanting to participate in events and having withdrawn. I have recommended an anti depressant. She notes he may have been on one earlier but is not now. I recommend cymbalta  30 mg po daily  to start, titrate as needed, to address LE pain and depression. He c/o arthritic pain and is on acetaminophen daily. I recommend Acetaminophen CR 650 to 1300 mg po  q 8 hrs for pain.  Intake: Endorses not liking the food always but albumin 3.6, stable and WNL.  Follow up Palliative Care Visit: Palliative care will continue to follow for complex medical decision   making, advance care planning, and clarification of goals. Return 2 weeks or prn.  I spent 45 minutes providing this consultation. More than 50% of the time in this consultation was spent in counseling and care coordination.  PPS: 30%  HOSPICE ELIGIBILITY/DIAGNOSIS: TBD  Chief Complaint: dementia, debility, frequent falls  HISTORY OF PRESENT ILLNESS:  [name] [name] is a 87 y.o. year old male  with sinus bradycardia, frequent falls, rib fx, dementia. He presents for [facility] consultation at juncture of finishing SNF stay and transferring back to ALF. He is improving with mobility, now using a walker to go to the  bathroom for fall prevention .   History obtained from review of EMR, discussion with primary team, and interview with family, facility staff/caregiver and/or Mr. Brashears.  I reviewed available labs, medications, imaging, studies and related documents from the EMR.  Records reviewed and summarized above.   ROS/staff/family   General: NAD ENMT: denies dysphagia Cardiovascular: denies chest pain, denies DOE Pulmonary: denies cough, denies increased SOB Abdomen: endorses fair appetite, denies constipation, endorses continence of bowel GU: denies dysuria, endorses continence of urine MSK:  endorses eakness,  no falls  at SNF reported Skin: endorses wounds, skin tears Neurological: endorses pain, denies insomnia Psych: Endorses  depressed mood Heme/lymph/immuno: denies bruises, abnormal bleeding  Physical Exam: Current and past weights: 172 lbs, 30 lb loss since 12/19.  Constitutional: NAD General: frail appearing EYES: anicteric sclera, lids intact, no discharge  ENMT: hard of hearing, oral mucous membranes moist, dentition with some missing CV:  no LE edema Pulmonary: no increased work of breathing, no cough, room air Abdomen: intake 75%, no cites GU: deferred MSK: + sarcopenia, moves all extremities,  non ambulatory Skin: warm and dry, no rashes or wounds on visible skin, skin tears reported Neuro:  ++generalized weakness,  ++cognitive impairment Psych: anxious affect, A and O x 2 Hem/lymph/immuno: no widespread bruising CURRENT PROBLEM LIST:  Patient Active Problem List   Diagnosis Date Noted   Pressure injury of skin 01/10/2021   Sinus bradycardia 01/09/2021   Multiple falls 01/09/2021   AKI (acute kidney injury) ([facility]) 01/09/2021   Rib fracture 01/09/2021   Elevated troponin 01/08/2021   PAST MEDICAL HISTORY:  Active Ambulatory Problems    Diagnosis Date Noted   Elevated troponin 01/08/2021   Sinus bradycardia 01/09/2021   Multiple falls 01/09/2021   AKI (acute  kidney injury) ([facility]) 01/09/2021   Rib fracture 01/09/2021   Pressure injury of skin 01/10/2021   Resolved Ambulatory Problems    Diagnosis Date Noted   No Resolved Ambulatory Problems   Past Medical History:  Diagnosis Date   Constipation    Hypothyroid    Major depressive disorder    Osteoarthritis (arthritis due to wear and tear of joints)    SOCIAL HX:  Social History   Tobacco Use   Smoking status: Never   Smokeless tobacco: Never  Substance Use Topics   Alcohol use: Not on file   FAMILY HX: No further family history attainable from patient or on chart review; no family present.    ALLERGIES: No Known Allergies   PERTINENT MEDICATIONS:  Outpatient Encounter Medications as of 01/24/2021  Medication Sig   acetaminophen (TYLENOL) 500 MG tablet Take 1,000 mg by mouth at bedtime. For knee pain.   aspirin 81 MG EC tablet Take 81 mg by mouth daily.   cetirizine (ZYRTEC) 10 MG tablet Take 10 mg by mouth daily. For allergies   cholecalciferol (VITAMIN D3) 10 MCG (400 UNIT)  TABS tablet Take 800 Units by mouth daily.   clopidogrel (PLAVIX) 75 MG tablet Take 1 tablet (75 mg total) by mouth daily.   docusate sodium (COLACE) 100 MG capsule Take 100 mg by mouth daily.   ferrous sulfate 325 (65 FE) MG tablet Take 325 mg by mouth daily.   levothyroxine (SYNTHROID) 75 MCG tablet Take 50 mcg by mouth daily. Take on an empty stomach with a glass of water at least 30 to 60 minutes before breakfast.   omeprazole (PRILOSEC) 20 MG capsule Take 20 mg by mouth daily. In the morning.   polyethylene glycol powder (GLYCOLAX/MIRALAX) 17 GM/SCOOP powder Take 17 g by mouth daily.   No facility-administered encounter medications on file as of 01/24/2021.    Thank you for the opportunity to participate in the care of Mr. Ramdass.  The palliative care team will continue to follow. Please call our office at [phone] if we can be of additional assistance.    McKelvey , NP , DNP,  AGPCNP-BC  COVID-19 PATIENT SCREENING TOOL Asked and negative response unless otherwise noted:  Have you had symptoms of covid, tested positive or been in contact with someone with symptoms/positive test in the past 5-10 days?  

## 2021-02-14 ENCOUNTER — Non-Acute Institutional Stay: Payer: Medicare Other | Admitting: Student

## 2021-02-14 ENCOUNTER — Other Ambulatory Visit: Payer: Self-pay

## 2021-02-14 DIAGNOSIS — R531 Weakness: Secondary | ICD-10-CM

## 2021-02-14 DIAGNOSIS — R52 Pain, unspecified: Secondary | ICD-10-CM

## 2021-02-14 DIAGNOSIS — Z515 Encounter for palliative care: Secondary | ICD-10-CM

## 2021-02-14 DIAGNOSIS — R001 Bradycardia, unspecified: Secondary | ICD-10-CM

## 2021-02-15 NOTE — Progress Notes (Signed)
[facility] Consult Note Telephone: [phone]  Fax: [phone]    Date of encounter: 02/14/2021  PATIENT NAME: [name] [name] [place] [address]   [phone] (home)  DOB: [birth year] MRN: [id number] PRIMARY CARE PROVIDER:    [facility],  [place] [address] [phone]  REFERRING PROVIDER:   [facility] [address] [place],  [place] [address] [phone]  RESPONSIBLE PARTY:    Contact Information     Name Relation Home Work Mobile   [name] [name],[name] [name] [place] Daughter   [phone]   Jp, [place]   [phone]        I met face to face with patient in the facility. Palliative Care was asked to follow this patient by consultation request of  [facility] to address advance care planning and complex medical decision making. This is a follow up visit.  Left message for patient's daughter [name] to follow-up on today's visit.                                   ASSESSMENT AND PLAN / RECOMMENDATIONS:   Advance Care Planning/Goals of Care: Goals include to maximize quality of life and symptom management.   CODE STATUS:DNR  Symptom Management/Plan:  Pain-secondary to osteoarthritis.  Continue acetaminophen as directed.   Generalized weakness-patient able to transfer self to and from chair. Facility staff state he is receiving therapy. Monitor for falls and safety.  Depression-patient has a history of depression.  He is not currently on any antidepressant; unsure of why patient is not currently on any antidepressant.  Recommend restarting antidepressant. Will follow-up with daughter.  Sinus Bradycardia-pulse 56 today. Hx of pulse in 40's. Patient was asymptomatic in hospital, HR range from 50-79. Will clarify if patient should be receiving carvedilol as no note is on file pertaining to order.   Follow up Palliative Care  Visit: Palliative care will continue to follow for complex medical decision making, advance care planning, and clarification of goals. Return in 6-8 weeks or prn.  I spent 25 minutes providing this consultation. More than 50% of the time in this consultation was spent in counseling and care coordination.   PPS: 40%  HOSPICE ELIGIBILITY/DIAGNOSIS: TBD  Chief Complaint: Palliative Medicine follow up visit.   HISTORY OF PRESENT ILLNESS:  [name] [name] is a 87 y.o. year old male  with sinus bradycardia, osteoarthritis, major depressive disorder, hypothyroidism, constipation, GERD, history of frequent falls, rib fracture.  [name] [name] currently resides at [facility].  He had a short stay at rehab facility status post hospitalization due to multiple falls.  Facility staff state patient is back to his baseline.  Staff denies patient having any recent falls since returning to facility. He is able to propel self in wheelchair. He has been going to dining room for meals; good appetite endorsed. Patient denies having pain, shortness of breath, nausea or constipation. Patient does appear agitated throughout visit. A 10-point review of systems is negative, except for the pertinent positives and negatives detailed in the HPI.    History obtained from review of EMR, discussion with primary team, and interview with family, facility staff/caregiver and/or [name] [name].  I reviewed available labs, medications, imaging, studies and related documents from the EMR.  Records reviewed and summarized above.    Physical Exam: Weight: 179 pounds Pulse  56, resp 16, b/p 112/70, sats 97% on room air Constitutional: NAD General: frail appearing, EYES: anicteric sclera, lids intact, no discharge  ENMT: intact hearing, oral mucous membranes moist, dentition intact CV: bradycardia, no LE edema Pulmonary: LCTA, no increased work of breathing, no cough Abdomen: normo-active BS + 4 quadrants, soft  and non tender GU: deferred MSK: no sarcopenia, moves all extremities Skin: warm and dry, no rashes or wounds on visible skin Neuro: generalized weakness, A & O x 2 Psych: non-anxious affect, agitation observed Hem/lymph/immuno: no widespread bruising   Thank you for the opportunity to participate in the care of [name] [name].  The palliative care team will continue to follow. Please call our office at [phone] if we can be of additional assistance.   [name], NP   COVID-19 PATIENT SCREENING TOOL Asked and negative response unless otherwise noted:   Have you had symptoms of covid, tested positive or been in contact with someone with symptoms/positive test in the past 5-10 days? No

## 2021-06-01 ENCOUNTER — Non-Acute Institutional Stay: Payer: Medicare Other | Admitting: Student

## 2021-06-01 ENCOUNTER — Other Ambulatory Visit: Payer: Self-pay

## 2021-06-01 DIAGNOSIS — Z515 Encounter for palliative care: Secondary | ICD-10-CM

## 2021-06-01 DIAGNOSIS — R531 Weakness: Secondary | ICD-10-CM

## 2021-06-01 DIAGNOSIS — R52 Pain, unspecified: Secondary | ICD-10-CM

## 2021-06-01 NOTE — Progress Notes (Signed)
[facility] Consult Note Telephone: [phone]  Fax: [phone]    Date of encounter: 06/01/21 10:19 AM PATIENT NAME: [name] [name] [name] [name] [name] [place] [address]   [phone] (home)  DOB: 1924/09/05 MRN: [id number] PRIMARY CARE PROVIDER:    [facility],  [place] [address] [phone]  REFERRING PROVIDER:   [facility] [address],  [place] [address] [phone]  RESPONSIBLE PARTY:    Contact Information     Name Relation Home Work Mobile   [name] [name] [name],[name] [name] [place] Daughter   [phone]   [name] [name] [name] [name] [name], [name] [name] [name] [name] [name]   [phone]        I met face to face with patient in the facility. Palliative Care was asked to follow this patient by consultation request of  [facility] to address advance care planning and complex medical decision making. This is a follow up visit.  Discussed with daughter [name] [name] [name] [name] [name] via telephone.                                    ASSESSMENT AND PLAN / RECOMMENDATIONS:   Advance Care Planning/Goals of Care: Goals include to maximize quality of life and symptom management. Patient/health care surrogate gave his/her permission to discuss. Our advance care planning conversation included a discussion about:    The value and importance of advance care planning  Experiences with loved ones who have been seriously ill or have died  Exploration of personal, cultural or spiritual beliefs that might influence medical decisions  Exploration of goals of care in the event of a sudden injury or illness  CODE STATUS: DNR  Spoke with daughter regarding Palliative Medicine vs. Hospice services. Patient is stable; he does not express any acute needs at this time. He is resistant to having visits. Given patient is stable, will discharge from Palliative services at this time. Discussed we can always pick patient back up should he need  any symptom management or decline and need hospice services. Daughter is in agreement.   Symptom Management/Plan:  Sinus Bradycardia-pulse 52 today. Long history of bradycardia. Monitor for worsening symptoms such as shortness of breath, chest pain, fatigue, increased confusion. Monitor for falls and safety.   Pain-secondary to his OA. Pain is currently managed; continue acetaminophen 1000 mg TID, Tramadol 89m every 8 hours pain.   Generalized weakness-patient able to transfer self to and from chair. Monitor for falls and safety  Follow up Palliative Care Visit: Palliative care will continue to follow for complex medical decision making, advance care planning, and clarification of goals.   This visit was coded based on medical decision making (MDM).  PPS: 40%  HOSPICE ELIGIBILITY/DIAGNOSIS: TBD  Chief Complaint: Palliative Medicine Follow up visit.  HISTORY OF PRESENT ILLNESS:  [name] [name] [name] [name] [name] a 88y.o. year old male  with OA, bradycardia, weakness, osteoarthritis, major depressive disorder, hypothyroidism, constipation, GERD, history of frequent falls, rib fracture.   [facility] Patient is mostly in his room; does go to dining room for meals. He does require assistance with adl's, but prefers to do things for himself. He does answer some direct questions today, but declines for b/p to be taken and becomes easily upset, agitated.  He denies pain at present, shortness of breath. Endorses a good appetite. States he is sleeping well at night. Staff deny any needs  at this time. No recent infections reported. Patient is received sitting up to his w/c, leaving bathroom. A 10-point review of systems is negative, except for the pertinent positives and negatives detailed in the HPI. Seen by PCP 05/10/2021    History obtained from review of EMR, discussion with primary team, and interview with family, facility staff/caregiver and/or [name] [name] [name] [name] [name].  I  reviewed available labs, medications, imaging, studies and related documents from the EMR.  Records reviewed and summarized above.     Physical Exam: Weight 1/25 185 pounds Pulse 52, resp 16, sats 95% on room air Constitutional: NAD General: frail appearing EYES: anicteric sclera, lids intact, no discharge  ENMT: intact hearing, oral mucous membranes moist, dentition intact CV: S1S2, RRR, no LE edema Pulmonary: LCTA, no increased work of breathing, no cough, room air Abdomen: normo-active BS + 4 quadrants, soft and non tender, no ascites GU: deferred MSK: moves all extremities, w/c bound Skin: warm and dry, no rashes or wounds on visible skin Neuro: generalized weakness, A & O x 2 Psych: non-anxious affect, agitated Hem/lymph/immuno: no widespread bruising   Thank you for the opportunity to participate in the care of [name] [name] [name] [name] [name].  The palliative care team will continue to follow. Please call our office at [phone] if we can be of additional assistance.   [name], NP   COVID-19 PATIENT SCREENING TOOL Asked and negative response unless otherwise noted:   Have you had symptoms of covid, tested positive or been in contact with someone with symptoms/positive test in the past 5-10 days? No

## 2021-10-05 ENCOUNTER — Non-Acute Institutional Stay: Payer: Medicare Other | Admitting: Student

## 2021-10-05 DIAGNOSIS — R531 Weakness: Secondary | ICD-10-CM

## 2021-10-05 DIAGNOSIS — R001 Bradycardia, unspecified: Secondary | ICD-10-CM

## 2021-10-05 DIAGNOSIS — S81801D Unspecified open wound, right lower leg, subsequent encounter: Secondary | ICD-10-CM

## 2021-10-05 DIAGNOSIS — Z515 Encounter for palliative care: Secondary | ICD-10-CM

## 2021-10-05 NOTE — Progress Notes (Unsigned)
[facility] Consult Note Telephone: [phone]  Fax: [phone]    Date of encounter: 10/05/21 11:38 AM PATIENT NAME: [name] [name] [place] [address]   [phone] (home)  DOB: 10-11-1924 MRN: [id number] PRIMARY CARE PROVIDER:    [facility],  [place] [address] [phone]  REFERRING PROVIDER:   [facility] [address],  [place] [address] [phone]  RESPONSIBLE PARTY:    Contact Information     Name Relation Home Work Mobile   [name] [name] [place] Daughter   [phone]   [name], [name]   [phone]        I met face to face with patient in the facility. Palliative Care was asked to follow this patient by consultation request of  [facility] to address advance care planning and complex medical decision making. This is a follow up visit.  Spoke with son regarding Palliative Medicine visit; reviewed goals of care.                                    ASSESSMENT AND PLAN / RECOMMENDATIONS:   Advance Care Planning/Goals of Care: Goals include to maximize quality of life and symptom management. Patient/health care surrogate gave his/her permission to discuss. Our advance care planning conversation included a discussion about:    The value and importance of advance care planning  Experiences with loved ones who have been seriously ill or have died  Exploration of personal, cultural or spiritual beliefs that might influence medical decisions  Exploration of goals of care in the event of a sudden injury or illness  Identification of a healthcare agent  CODE STATUS: DNR  Education provided on Palliative Medicine vs. Hospice services. Will monitor for functional and cognitive declines.    Symptom Management/Plan:    Follow up Palliative Care Visit: Palliative care will continue to follow for complex  medical decision making, advance care planning, and clarification of goals. Return *** weeks or prn.  I spent *** minutes providing this consultation. More than 50% of the time in this consultation was spent in counseling and care coordination.  This visit was coded based on medical decision making (MDM).***  PPS: ***0%  HOSPICE ELIGIBILITY/DIAGNOSIS: TBD  Chief Complaint: Palliative Medicine initial consult.   HISTORY OF PRESENT ILLNESS:  [name] [name] is a 88 y.o. year old male  with *** .   History obtained from review of EMR, discussion with primary team, and interview with family, facility staff/caregiver and/or [name] [name].  I reviewed available labs, medications, imaging, studies and related documents from the EMR.  Records reviewed and summarized above.   ROS  *** General: NAD EYES: denies vision changes ENMT: denies dysphagia Cardiovascular: denies chest pain, denies DOE Pulmonary: denies cough, denies increased SOB Abdomen: endorses good appetite, denies constipation, endorses continence of bowel GU: denies dysuria, endorses continence of urine MSK:  denies increased weakness,  no falls reported Skin: denies rashes or wounds Neurological: denies pain, denies insomnia Psych: Endorses positive mood Heme/lymph/immuno: denies bruises, abnormal bleeding  Physical Exam: Pulse 60, resp 16, sats 97% on room air Constitutional: NAD General: frail appearing, thin/WNWD/obese  EYES: anicteric sclera, lids intact, no discharge  ENMT: intact hearing, oral mucous membranes moist, dentition intact CV: S1S2, RRR, no LE edema Pulmonary: LCTA, no increased work of breathing, no cough, room air  Abdomen: intake 100%, normo-active BS + 4 quadrants, soft and non tender, no ascites GU: deferred MSK: no sarcopenia, moves all extremities, ambulatory Skin: warm and dry, no rashes or wounds on visible skin Neuro:  no generalized weakness,  no cognitive impairment Psych: non-anxious affect,  A and O x 3 Hem/lymph/immuno: no widespread bruising   Thank you for the opportunity to participate in the care of [name] [name].  The palliative care team will continue to follow. Please call our office at [phone] if we can be of additional assistance.   [name], NP   COVID-19 PATIENT SCREENING TOOL Asked and negative response unless otherwise noted:   Have you had symptoms of covid, tested positive or been in contact with someone with symptoms/positive test in the past 5-10 days?

## 2022-02-13 ENCOUNTER — Non-Acute Institutional Stay: Payer: Medicare Other | Admitting: Student

## 2022-02-13 DIAGNOSIS — Z515 Encounter for palliative care: Secondary | ICD-10-CM

## 2022-02-13 DIAGNOSIS — R001 Bradycardia, unspecified: Secondary | ICD-10-CM

## 2022-02-13 DIAGNOSIS — R531 Weakness: Secondary | ICD-10-CM

## 2022-02-14 NOTE — Progress Notes (Signed)
[facility] Consult Note Telephone: [phone]  Fax: [phone]    Date of encounter: 02/13/22 11:40AM PATIENT NAME: [name] [name] [place] [address]   [phone] (home)  DOB: April 04, 1925 MRN: [id number] PRIMARY CARE PROVIDER:    [facility],  [place] [address] [phone]  REFERRING PROVIDER:   [facility], [facility] [address],  [place] [address] [phone]  RESPONSIBLE PARTY:    Contact Information     Name Relation Home Work Mobile   [name],[name] [place] Daughter   [phone]   [place], [place]   [phone]        I met face to face with patient in the facility. Palliative Care was asked to follow this patient by consultation request of  [facility] to address advance care planning and complex medical decision making. This is a follow up visit.                                   ASSESSMENT AND PLAN / RECOMMENDATIONS:   Advance Care Planning/Goals of Care: Goals include to maximize quality of life and symptom management. Patient/health care surrogate gave his/her permission to discuss. Our advance care planning conversation included a discussion about:    The value and importance of advance care planning  Experiences with loved ones who have been seriously ill or have died  Exploration of personal, cultural or spiritual beliefs that might influence medical decisions  Exploration of goals of care in the event of a sudden injury or illness  Son [name] [name], [place] CODE STATUS: DNR  Symptom Management/Plan:  Sinus bradycardia-patient with long history of bradycardia. He continues to exhibit weakness; no worsening per staff. He continues to express wanting to be as independent as possible. Will monitor for functional decline, worsening symptoms such as shortness of breath, chest pain, fatigue, increased  confusion. Patient has been previously seen by cardiology; no further interventions.  Generalized weakness-due to his bradycardia. Patient does require assistance with adl's. He attempts to care for himself, will allow staff to assist as needed. Staff encouraged to assist with adl's as needed, monitor for falls/safety. Use w/c for locomotion.   Follow up Palliative Care Visit: Palliative care will continue to follow for complex medical decision making, advance care planning, and clarification of goals. Return in 8 weeks or prn.  This visit was coded based on medical decision making (MDM).  PPS: 40%  HOSPICE ELIGIBILITY/DIAGNOSIS: TBD  Chief Complaint: Palliative Medicine follow up visit.   HISTORY OF PRESENT ILLNESS:  [name] [name] is a 88 y.o. year old male  with OA, bradycardia, weakness, osteoarthritis, major depressive disorder, hypothyroidism, constipation, GERD, history of frequent falls, rib fracture.    Patient received in his room. He is getting ready to go to dining room for lunch. He denies any pain, shortness of breath. No recent falls reported per staff. He states he "needs peace" when asked if he needed anything. He declines further physical assessment. He did allow NP to listen to heart and asked to be pushed to dining room. Staff reports that he has been followed by Home health for wound care.   History obtained from review of EMR, discussion with primary team, and interview with family, facility staff/caregiver and/or [name] [name].  I reviewed available labs, medications, imaging, studies and related documents from the EMR.  Records reviewed and summarized above.   ROS  A 10-Point ROS is negative, except for the pertinent positives and negatives detailed per the HPI.   Physical Exam: Weight: 176. 4 pounds Pulse 56, resp 16 Constitutional: NAD General: frail appearing EYES: anicteric sclera, lids intact, no discharge  ENMT: hard of  hearing, oral mucous membranes  moist CV: S1S2, bradycardia, no LE edema Pulmonary: LCTA, no increased work of breathing, no cough, room air Abdomen: normo-active BS + 4 quadrants, soft and non tender GU: deferred MSK:  moves all extremities, non-ambulatory Skin: warm and dry, no rashes or wounds on visible skin Neuro:  + generalized weakness,  + cognitive impairment Psych: non-anxious affect, A and O x 2 Hem/lymph/immuno: no widespread bruising   Thank you for the opportunity to participate in the care of [name] [name].  The palliative care team will continue to follow. Please call our office at [phone] if we can be of additional assistance.   [name] Slocumb, NP   COVID-19 PATIENT SCREENING TOOL Asked and negative response unless otherwise noted:   Have you had symptoms of covid, tested positive or been in contact with someone with symptoms/positive test in the past 5-10 days? No

## 2022-05-28 ENCOUNTER — Emergency Department: Payer: Medicare Other

## 2022-05-28 ENCOUNTER — Emergency Department
Admission: EM | Admit: 2022-05-28 | Discharge: 2022-05-29 | Disposition: A | Discharge status: Skilled Nursing Facility | Payer: Medicare Other | Attending: Emergency Medicine | Admitting: Emergency Medicine

## 2022-05-28 ENCOUNTER — Other Ambulatory Visit: Payer: Self-pay

## 2022-05-28 ENCOUNTER — Encounter: Payer: Self-pay | Admitting: *Deleted

## 2022-05-28 DIAGNOSIS — R4182 Altered mental status, unspecified: Secondary | ICD-10-CM | POA: Diagnosis not present

## 2022-05-28 DIAGNOSIS — N3000 Acute cystitis without hematuria: Secondary | ICD-10-CM

## 2022-05-28 DIAGNOSIS — R944 Abnormal results of kidney function studies: Secondary | ICD-10-CM | POA: Insufficient documentation

## 2022-05-28 DIAGNOSIS — F0390 Unspecified dementia without behavioral disturbance: Secondary | ICD-10-CM | POA: Diagnosis not present

## 2022-05-28 DIAGNOSIS — J189 Pneumonia, unspecified organism: Secondary | ICD-10-CM

## 2022-05-28 DIAGNOSIS — J181 Lobar pneumonia, unspecified organism: Secondary | ICD-10-CM | POA: Insufficient documentation

## 2022-05-28 DIAGNOSIS — E039 Hypothyroidism, unspecified: Secondary | ICD-10-CM | POA: Insufficient documentation

## 2022-05-28 DIAGNOSIS — R531 Weakness: Secondary | ICD-10-CM | POA: Diagnosis present

## 2022-05-28 LAB — URINALYSIS, ROUTINE W REFLEX MICROSCOPIC
Bilirubin Urine: NEGATIVE
Glucose, UA: NEGATIVE mg/dL
Ketones, ur: NEGATIVE mg/dL
Nitrite: NEGATIVE
Protein, ur: 30 mg/dL — AB
Specific Gravity, Urine: 1.024 (ref 1.005–1.030)
WBC, UA: >50 WBC/hpf — ABNORMAL HIGH (ref 0–5)
pH: 5 (ref 5.0–8.0)

## 2022-05-28 LAB — CBC
HCT: 40.4 % (ref 39.0–52.0)
Hemoglobin: 12.7 g/dL — ABNORMAL LOW (ref 13.0–17.0)
MCH: 32.3 pg (ref 26.0–34.0)
MCHC: 31.4 g/dL (ref 30.0–36.0)
MCV: 102.8 fL — ABNORMAL HIGH (ref 80.0–100.0)
Platelets: 139 10*3/uL — ABNORMAL LOW (ref 150–400)
RBC: 3.93 MIL/uL — ABNORMAL LOW (ref 4.22–5.81)
RDW: 15.5 % (ref 11.5–15.5)
WBC: 5.7 10*3/uL (ref 4.0–10.5)
nRBC: 0 % (ref 0.0–0.2)

## 2022-05-28 LAB — BASIC METABOLIC PANEL
Anion gap: 8 (ref 5–15)
BUN: 38 mg/dL — ABNORMAL HIGH (ref 8–23)
CO2: 26 mmol/L (ref 22–32)
Calcium: 9.6 mg/dL (ref 8.9–10.3)
Chloride: 108 mmol/L (ref 98–111)
Creatinine, Ser: 0.86 mg/dL (ref 0.61–1.24)
GFR, Estimated: >60 mL/min (ref >60)
Glucose, Bld: 112 mg/dL — ABNORMAL HIGH (ref 70–99)
Potassium: 4.3 mmol/L (ref 3.5–5.1)
Sodium: 142 mmol/L (ref 135–145)

## 2022-05-28 LAB — TROPONIN I (HIGH SENSITIVITY)
Troponin I (High Sensitivity): 26 ng/L — ABNORMAL HIGH (ref <18)
Troponin I (High Sensitivity): 27 ng/L — ABNORMAL HIGH (ref <18)

## 2022-05-28 MED ORDER — CEFPODOXIME PROXETIL 200 MG PO TABS: 200.0000 mg | ORAL_TABLET | Freq: Two times a day (BID) | ORAL | 0 refills | Status: AC

## 2022-05-28 MED ORDER — SODIUM CHLORIDE 0.9 % IV BOLUS
1000.0000 mL | Freq: Once | INTRAVENOUS | Status: AC
  Administered 2022-05-28: 1000 mL via INTRAVENOUS

## 2022-05-28 MED ORDER — AZITHROMYCIN 250 MG PO TABS: ORAL_TABLET | ORAL | 0 refills | Status: AC

## 2022-05-28 MED ORDER — SODIUM CHLORIDE 0.9 % IV SOLN
1.0000 g | Freq: Once | INTRAVENOUS | Status: AC
  Administered 2022-05-28: 1 g via INTRAVENOUS
  Filled 2022-05-28: qty 10

## 2022-05-28 NOTE — ED Triage Notes (Signed)
First Nurse Note:  Pt via EMS from [facility]. Staff states pt is here for AMS, pt is normally A&Ox3. States that he is more confused than normal. Pt has a sacral wound with a strong odor per EMS  66 with hx of a fib  20-22 RR  18-23 ETCO2  114/62 BP  20G L forearm, EMS report  138 CBG

## 2022-05-28 NOTE — ED Triage Notes (Signed)
Pt brought in via ems from [facility] with altered mental status.  Ems report more confused today.   Iv in place.  Pt reports pain in his ass per pt.  Pt has a sacral wound.  Pt alert

## 2022-05-28 NOTE — ED Provider Notes (Signed)
[facility] Provider Note    Event Date/Time   First MD Initiated Contact with Patient 05/28/22 2117     (approximate)   History   Chief Complaint Altered Mental Status and Weakness   HPI  [name] [name] is a 89 y.o. male with past medical history of hypothyroidism and dementia who presents to the ED for altered mental status.  57 of history is obtained from patient's son at bedside, who reports that patient has been slightly weaker than usual over the past couple of days with increased confusion.  He had called him from his nursing facility earlier today to say that all of the staff had left and he had not gotten his morning medications, when in fact staff were there with him and had administered medications.  He has had decreased oral intake over the past couple of days, but has not complained of any pain, cough, fever, nausea, vomiting, or diarrhea.  Patient currently denies any complaints, is not sure why he is here.     Physical Exam   Triage Vital Signs: ED Triage Vitals  Enc Vitals Group     BP 05/28/22 1528 114/72     Pulse Rate 05/28/22 1535 (!) 57     Resp 05/28/22 1528 20     Temp 05/28/22 1535 (!) 97 F (36.1 C)     Temp Source 05/28/22 1528 Axillary     SpO2 05/28/22 1528 98 %     Weight 05/28/22 1529 185 lb 3 oz (84 kg)     Height 05/28/22 1529 5\' 8"  (1.727 m)     Head Circumference --      Peak Flow --      Pain Score 05/28/22 1529 9     Pain Loc --      Pain Edu? --      Excl. in [place]? --     Most recent vital signs: Vitals:   05/28/22 1535 05/28/22 1745  BP:  116/75  Pulse: (!) 57 77  Resp:  20  Temp: (!) 97 F (36.1 C) (!) 97 F (36.1 C)  SpO2:  95%    Constitutional: Alert and oriented to person, but not place, time, or situation. Eyes: Conjunctivae are normal. Head: Atraumatic. Nose: No congestion/rhinnorhea. Mouth/Throat: Mucous membranes are moist.  Cardiovascular: Normal rate, regular rhythm. Grossly normal  heart sounds.  2+ radial pulses bilaterally. Respiratory: Normal respiratory effort.  No retractions. Lungs CTAB. Gastrointestinal: Soft and nontender. No distention. Musculoskeletal: No lower extremity tenderness nor edema.  Neurologic:  Normal speech and language. No gross focal neurologic deficits are appreciated.    ED Results / Procedures / Treatments   Labs (all labs ordered are listed, but only abnormal results are displayed) Labs Reviewed  BASIC METABOLIC PANEL - Abnormal; Notable for the following components:      Result Value   Glucose, Bld 112 (*)    BUN 38 (*)    All other components within normal limits  CBC - Abnormal; Notable for the following components:   RBC 3.93 (*)    Hemoglobin 12.7 (*)    MCV 102.8 (*)    Platelets 139 (*)    All other components within normal limits  URINALYSIS, ROUTINE W REFLEX MICROSCOPIC - Abnormal; Notable for the following components:   Color, Urine AMBER (*)    APPearance CLOUDY (*)    Hgb urine dipstick MODERATE (*)    Protein, ur 30 (*)    Leukocytes,Ua LARGE (*)  WBC, UA >50 (*)    Bacteria, UA RARE (*)    All other components within normal limits  TROPONIN I (HIGH SENSITIVITY) - Abnormal; Notable for the following components:   Troponin I (High Sensitivity) 26 (*)    All other components within normal limits  TROPONIN I (HIGH SENSITIVITY) - Abnormal; Notable for the following components:   Troponin I (High Sensitivity) 27 (*)    All other components within normal limits  URINE CULTURE     EKG  ED ECG REPORT I, Chesley Noon, the attending physician, personally viewed and interpreted this ECG.   Date: 05/28/2022  EKG Time: 15:31  Rate: 64  Rhythm: normal sinus rhythm, PAC's noted, occasional PVC noted, unifocal  Axis: Normal  Intervals:right bundle branch block  ST&T Change: None  RADIOLOGY Chest x-ray reviewed and interpreted by me with infiltrates at bilateral bases, no edema or effusion  noted.  PROCEDURES:  Critical Care performed: No  Procedures   MEDICATIONS ORDERED IN ED: Medications  cefTRIAXone (ROCEPHIN) 1 g in sodium chloride 0.9 % 100 mL IVPB (has no administration in time range)  sodium chloride 0.9 % bolus 1,000 mL (has no administration in time range)     IMPRESSION / MDM / ASSESSMENT AND PLAN / ED COURSE  I reviewed the triage vital signs and the nursing notes.                              89 y.o. male with past medical history of hypothyroidism, and dementia who presents to the ED with increased confusion, weakness, and decreased oral intake over the past couple of days.  Patient's presentation is most consistent with acute presentation with potential threat to life or bodily function.  Differential diagnosis includes, but is not limited to, stroke, other intracranial process, dehydration, electrolyte abnormality, AKI, anemia, UTI, pneumonia, ACS.  Patient chronically ill but nontoxic-appearing and in no acute distress, vital signs are unremarkable.  He is disoriented compared to his baseline but with no focal neurologic deficits on exam, remains awake and alert.  Low suspicion for intracranial process, urinalysis appears consistent with UTI and this is likely the source of his symptoms.  EKG shows frequent PVCs but no arrhythmia or ischemia, troponin mildly elevated but stable on recheck and I doubt cardiac etiology for his symptoms.  Remainder of labs are remarkable for elevated BUN to creatinine ratio and patient may be dehydrated, will give IV fluids in addition to dose of IV Rocephin.  No significant electrolyte abnormality, anemia, or leukocytosis noted.  No vital sign abnormality concerning for sepsis.  Chest x-ray does show possible developing pneumonia, but patient has no respiratory symptoms at this time and continues to maintain oxygen saturations at 95% on room air.  Additional exam of patient's sacral area shows mild pressure injury with tissue  breakdown but no signs of infection.  Per son, patient gets regular wound care at his nursing facility.  He is appropriate for discharge home and will treat with cefpodoxime and azithromycin for UTI as well as possible pneumonia.  Son counseled to have patient return to the ED for new or worsening symptoms, patient and son agree with plan.      FINAL CLINICAL IMPRESSION(S) / ED DIAGNOSES   Final diagnoses:  Altered mental status, unspecified altered mental status type  Acute cystitis without hematuria  Pneumonia of both lower lobes due to infectious organism     Rx / DC  Orders   ED Discharge Orders          Ordered    cefpodoxime (VANTIN) 200 MG tablet  2 times daily        05/28/22 2255    azithromycin (ZITHROMAX Z-PAK) 250 MG tablet        05/28/22 2255             Note:  This document was prepared using Dragon voice recognition software and may include unintentional dictation errors.   Blake Divine, MD 05/28/22 2256

## 2022-05-30 LAB — URINE CULTURE

## 2022-06-06 ENCOUNTER — Emergency Department: Payer: Medicare Other

## 2022-06-06 ENCOUNTER — Other Ambulatory Visit: Payer: Self-pay

## 2022-06-06 ENCOUNTER — Encounter: Payer: Self-pay | Admitting: Emergency Medicine

## 2022-06-06 ENCOUNTER — Inpatient Hospital Stay
Admission: EM | Admit: 2022-06-06 | Discharge: 2022-06-07 | DRG: 951 | Disposition: A | Discharge status: Hospice | Payer: Medicare Other | Attending: Student | Admitting: Student

## 2022-06-06 DIAGNOSIS — Z515 Encounter for palliative care: Principal | ICD-10-CM

## 2022-06-06 DIAGNOSIS — F329 Major depressive disorder, single episode, unspecified: Secondary | ICD-10-CM | POA: Diagnosis present

## 2022-06-06 DIAGNOSIS — I452 Bifascicular block: Secondary | ICD-10-CM | POA: Diagnosis present

## 2022-06-06 DIAGNOSIS — Z7902 Long term (current) use of antithrombotics/antiplatelets: Secondary | ICD-10-CM | POA: Diagnosis not present

## 2022-06-06 DIAGNOSIS — Z6826 Body mass index (BMI) 26.0-26.9, adult: Secondary | ICD-10-CM

## 2022-06-06 DIAGNOSIS — E039 Hypothyroidism, unspecified: Secondary | ICD-10-CM | POA: Diagnosis present

## 2022-06-06 DIAGNOSIS — R296 Repeated falls: Secondary | ICD-10-CM | POA: Diagnosis present

## 2022-06-06 DIAGNOSIS — I5021 Acute systolic (congestive) heart failure: Secondary | ICD-10-CM | POA: Diagnosis present

## 2022-06-06 DIAGNOSIS — Z79899 Other long term (current) drug therapy: Secondary | ICD-10-CM | POA: Diagnosis not present

## 2022-06-06 DIAGNOSIS — Z7982 Long term (current) use of aspirin: Secondary | ICD-10-CM

## 2022-06-06 DIAGNOSIS — Z1152 Encounter for screening for COVID-19: Secondary | ICD-10-CM | POA: Diagnosis not present

## 2022-06-06 DIAGNOSIS — L89152 Pressure ulcer of sacral region, stage 2: Secondary | ICD-10-CM | POA: Diagnosis present

## 2022-06-06 DIAGNOSIS — J810 Acute pulmonary edema: Secondary | ICD-10-CM | POA: Diagnosis not present

## 2022-06-06 DIAGNOSIS — Z66 Do not resuscitate: Secondary | ICD-10-CM | POA: Diagnosis present

## 2022-06-06 DIAGNOSIS — R627 Adult failure to thrive: Secondary | ICD-10-CM | POA: Diagnosis present

## 2022-06-06 DIAGNOSIS — R001 Bradycardia, unspecified: Secondary | ICD-10-CM | POA: Diagnosis present

## 2022-06-06 DIAGNOSIS — I5023 Acute on chronic systolic (congestive) heart failure: Secondary | ICD-10-CM | POA: Diagnosis present

## 2022-06-06 DIAGNOSIS — F0390 Unspecified dementia without behavioral disturbance: Secondary | ICD-10-CM | POA: Diagnosis present

## 2022-06-06 DIAGNOSIS — R64 Cachexia: Secondary | ICD-10-CM | POA: Diagnosis present

## 2022-06-06 DIAGNOSIS — Z79890 Hormone replacement therapy (postmenopausal): Secondary | ICD-10-CM

## 2022-06-06 DIAGNOSIS — R339 Retention of urine, unspecified: Secondary | ICD-10-CM | POA: Diagnosis present

## 2022-06-06 DIAGNOSIS — E860 Dehydration: Secondary | ICD-10-CM | POA: Diagnosis present

## 2022-06-06 DIAGNOSIS — R4182 Altered mental status, unspecified: Principal | ICD-10-CM

## 2022-06-06 LAB — CBC WITH DIFFERENTIAL/PLATELET
Abs Immature Granulocytes: 0.03 10*3/uL (ref 0.00–0.07)
Basophils Absolute: 0 10*3/uL (ref 0.0–0.1)
Basophils Relative: 1 %
Eosinophils Absolute: 0.1 10*3/uL (ref 0.0–0.5)
Eosinophils Relative: 2 %
HCT: 44.6 % (ref 39.0–52.0)
Hemoglobin: 14 g/dL (ref 13.0–17.0)
Immature Granulocytes: 1 %
Lymphocytes Relative: 9 %
Lymphs Abs: 0.4 10*3/uL — ABNORMAL LOW (ref 0.7–4.0)
MCH: 32.5 pg (ref 26.0–34.0)
MCHC: 31.4 g/dL (ref 30.0–36.0)
MCV: 103.5 fL — ABNORMAL HIGH (ref 80.0–100.0)
Monocytes Absolute: 0.5 10*3/uL (ref 0.1–1.0)
Monocytes Relative: 10 %
Neutro Abs: 3.6 10*3/uL (ref 1.7–7.7)
Neutrophils Relative %: 77 %
Platelets: 100 10*3/uL — ABNORMAL LOW (ref 150–400)
RBC: 4.31 MIL/uL (ref 4.22–5.81)
RDW: 15.9 % — ABNORMAL HIGH (ref 11.5–15.5)
WBC: 4.7 10*3/uL (ref 4.0–10.5)
nRBC: 0 % (ref 0.0–0.2)

## 2022-06-06 LAB — COMPREHENSIVE METABOLIC PANEL
ALT: 27 U/L (ref 0–44)
AST: 42 U/L — ABNORMAL HIGH (ref 15–41)
Albumin: 3.9 g/dL (ref 3.5–5.0)
Alkaline Phosphatase: 42 U/L (ref 38–126)
Anion gap: 11 (ref 5–15)
BUN: 43 mg/dL — ABNORMAL HIGH (ref 8–23)
CO2: 19 mmol/L — ABNORMAL LOW (ref 22–32)
Calcium: 10 mg/dL (ref 8.9–10.3)
Chloride: 113 mmol/L — ABNORMAL HIGH (ref 98–111)
Creatinine, Ser: 0.99 mg/dL (ref 0.61–1.24)
GFR, Estimated: >60 mL/min (ref >60)
Glucose, Bld: 82 mg/dL (ref 70–99)
Potassium: 4.7 mmol/L (ref 3.5–5.1)
Sodium: 143 mmol/L (ref 135–145)
Total Bilirubin: 1.2 mg/dL (ref 0.3–1.2)
Total Protein: 6.1 g/dL — ABNORMAL LOW (ref 6.5–8.1)

## 2022-06-06 LAB — RESP PANEL BY RT-PCR (RSV, FLU A&B, COVID)  RVPGX2
Influenza A by PCR: NEGATIVE
Influenza B by PCR: NEGATIVE
Resp Syncytial Virus by PCR: NEGATIVE
SARS Coronavirus 2 by RT PCR: NEGATIVE

## 2022-06-06 LAB — LACTIC ACID, PLASMA
Lactic Acid, Venous: 2.4 mmol/L (ref 0.5–1.9)
Lactic Acid, Venous: 2 mmol/L (ref 0.5–1.9)

## 2022-06-06 LAB — BRAIN NATRIURETIC PEPTIDE: B Natriuretic Peptide: 1059.6 pg/mL — ABNORMAL HIGH (ref 0.0–100.0)

## 2022-06-06 LAB — TSH: TSH: 4.272 u[IU]/mL (ref 0.350–4.500)

## 2022-06-06 LAB — TROPONIN I (HIGH SENSITIVITY)
Troponin I (High Sensitivity): 27 ng/L — ABNORMAL HIGH (ref <18)
Troponin I (High Sensitivity): 24 ng/L — ABNORMAL HIGH (ref <18)

## 2022-06-06 LAB — T4, FREE: Free T4: 1.4 ng/dL — ABNORMAL HIGH (ref 0.61–1.12)

## 2022-06-06 MED ORDER — SODIUM CHLORIDE 0.9 % IV BOLUS: 1000.0000 mL | Freq: Once | INTRAVENOUS | Status: DC

## 2022-06-06 MED ORDER — LORAZEPAM 2 MG/ML IJ SOLN
1.0000 mg | INTRAMUSCULAR | Status: DC | PRN
  Administered 2022-06-07: 1 mg via INTRAVENOUS
  Filled 2022-06-06: qty 1

## 2022-06-06 MED ORDER — LORAZEPAM 1 MG PO TABS: 1.0000 mg | ORAL_TABLET | ORAL | Status: DC | PRN

## 2022-06-06 MED ORDER — MORPHINE SULFATE (PF) 2 MG/ML IV SOLN
1.0000 mg | INTRAVENOUS | Status: DC | PRN
  Administered 2022-06-06 – 2022-06-07 (×6): 1 mg via INTRAVENOUS
  Filled 2022-06-06 (×6): qty 1

## 2022-06-06 MED ORDER — ONDANSETRON 4 MG PO TBDP: 4.0000 mg | ORAL_TABLET | Freq: Four times a day (QID) | ORAL | Status: DC | PRN

## 2022-06-06 MED ORDER — LORAZEPAM 2 MG/ML PO CONC: 1.0000 mg | ORAL | Status: DC | PRN

## 2022-06-06 MED ORDER — ONDANSETRON HCL 4 MG/2ML IJ SOLN: 4.0000 mg | Freq: Four times a day (QID) | INTRAMUSCULAR | Status: DC | PRN

## 2022-06-06 MED ORDER — FUROSEMIDE 10 MG/ML IJ SOLN
20.0000 mg | Freq: Once | INTRAMUSCULAR | Status: AC
  Administered 2022-06-06: 20 mg via INTRAVENOUS
  Filled 2022-06-06: qty 4

## 2022-06-06 NOTE — Assessment & Plan Note (Signed)
Patient has a history of HFrEF with last EF less than 20% in 2022 with a restrictive pattern.  Today, he is presenting with elevated BNP, pulmonary edema, right-sided pleural effusion and significant lower extremity edema consistent with acute on chronic heart failure exacerbation.  As noted above, comfort care measures are being pursued.  - Discontinue Lasix - Discontinue telemetry

## 2022-06-06 NOTE — Assessment & Plan Note (Addendum)
Patient initially presented with increased lethargy found to have elevated BNP and pulmonary edema consistent with acute on chronic heart failure.  Patient's son at bedside feels that [name] [name] has been gradually declining over the past 1 month, and he would not want to continue suffering.  He and his sister have decided that they would like comfort measures only to allow [name] [name] to pass in peace.  - Patient is an established with a [facility] already; hospital liaison consulted for consideration of inpatient hospice - Morphine as needed for air hunger or pain.  Currently ordered every 2 hours as needed.  Advance to infusion if air hunger becomes uncontrolled. - Ativan as needed for anxiety - Discontinue home medications - Discontinue all monitoring labs and lines

## 2022-06-06 NOTE — ED Notes (Signed)
Pt alert drinking milk with family at bedside. Pt appears comfortable and denies any needs at this time.

## 2022-06-06 NOTE — ED Notes (Addendum)
Assumed care from [facility]. Pt resting comfortably in bed at this time. Pt denies any current needs or questions. Call light with in reach. Family at bedside. Pt cleaned of incontinence. Pt on male [place]. Pt taken off cardiac and additional vital sign monitoring.

## 2022-06-06 NOTE — ED Provider Notes (Signed)
[facility] Provider Note    Event Date/Time   First MD Initiated Contact with Patient 06/06/22 1235     (approximate)   History   Altered Mental Status   HPI  [name] [name] is a 89 y.o. male   Past medical history of hypothyroid on Synthroid, depression, dementia who presents from facility for altered mental status, lethargy, generalized weakness.  By EMS report, facility reports usually up and moving and had a normal day yesterday but is altered today.  No reported trauma, fever, cough or focal infectious symptoms.  Patient is altered, unclear baseline though provider note from 05/28/2022 in the emergency department notes history gathering at that time was significantly impaired due to patient's altered mental status much of information was given by son.  The patient states that he has pain all over, feels weak, but denies any focal pain or infectious symptoms.   External Medical Documents Reviewed: Emergency department visit dated 05/28/2022 for altered mental status found to have urinary tract infection and questionable pneumonia and was discharged on abx      Physical Exam   Triage Vital Signs: ED Triage Vitals [06/06/22 1235]  Enc Vitals Group     BP      Pulse      Resp      Temp      Temp src      SpO2      Weight 176 lb 5.9 oz (80 kg)     Height 5\' 8"  (1.727 m)     Head Circumference      Peak Flow      Pain Score      Pain Loc      Pain Edu?      Excl. in [place]?     Most recent vital signs: Vitals:   06/06/22 1430 06/06/22 1500  BP: 121/75 106/68  Pulse:  (!) 46  Resp: (!) 22 17  SpO2:  96%    General: Awake, chronically ill-appearing, answering questions, moving all extremities though diffuse weakness. CV:   Mucous membranes dry and skin turgor is poor, he appears slightly dehydrated Resp:  Normal effort.  Lungs to auscultation.  Was put on 3 to 4 L nasal cannula oxygen by EMS on arrival, but when I remove on room air 94%.   Will continue to monitor. Abd:  No distention.  Nontender, no rigidity. Superficial sacral pressure ulcer does not look infected   ED Results / Procedures / Treatments   Labs (all labs ordered are listed, but only abnormal results are displayed) Labs Reviewed  LACTIC ACID, PLASMA - Abnormal; Notable for the following components:      Result Value   Lactic Acid, Venous 2.4 (*)    All other components within normal limits  COMPREHENSIVE METABOLIC PANEL - Abnormal; Notable for the following components:   Chloride 113 (*)    CO2 19 (*)    BUN 43 (*)    Total Protein 6.1 (*)    AST 42 (*)    All other components within normal limits  CBC WITH DIFFERENTIAL/PLATELET - Abnormal; Notable for the following components:   MCV 103.5 (*)    RDW 15.9 (*)    Platelets 100 (*)    Lymphs Abs 0.4 (*)    All other components within normal limits  T4, FREE - Abnormal; Notable for the following components:   Free T4 1.40 (*)    All other components within normal limits  BRAIN NATRIURETIC  PEPTIDE - Abnormal; Notable for the following components:   B Natriuretic Peptide 1,059.6 (*)    All other components within normal limits  TROPONIN I (HIGH SENSITIVITY) - Abnormal; Notable for the following components:   Troponin I (High Sensitivity) 27 (*)    All other components within normal limits  RESP PANEL BY RT-PCR (RSV, FLU A&B, COVID)  [id number]  TSH  LACTIC ACID, PLASMA  URINALYSIS, W/ REFLEX TO CULTURE (INFECTION SUSPECTED)  TROPONIN I (HIGH SENSITIVITY)     I ordered and reviewed the above labs they are notable for the T4 slightly elevated at 1.40, initial troponin is 27 and a BNP of 1000+, lactic acid is 2.4.  EKG  ED ECG REPORT I, Lucillie Garfinkel, the attending physician, personally viewed and interpreted this ECG.   Date: 06/06/2022  EKG Time: 1237  Rate: 43  Rhythm: sinus bradycardia  Intervals: LAFB RBBB  ST&T Change: no acute ischemic changes   RADIOLOGY I independently reviewed and  interpreted chest x-ray and see diffuse infiltrates suggestive of pulmonary edema.   PROCEDURES:  Critical Care performed: No  Procedures   MEDICATIONS ORDERED IN ED: Medications  furosemide (LASIX) injection 20 mg (20 mg Intravenous Given 06/06/22 1505)    External physician / consultants:  I spoke with hospitalist for admission and regarding care plan for this patient.   IMPRESSION / MDM / ASSESSMENT AND PLAN / ED COURSE  I reviewed the triage vital signs and the nursing notes.                                Patient's presentation is most consistent with acute presentation with potential threat to life or bodily function.  Differential diagnosis includes, but is not limited to, metabolic encephalopathy, infection, intracranial bleed, hypothyroid, dehydration   The patient is on the cardiac monitor to evaluate for evidence of arrhythmia and/or significant heart rate changes.  MDM: Patient with dementia at baseline with altered mental status, lethargy, generalized weakness will assess with metabolic/infectious workup including labs, chest x-ray, urinalysis CT head.  Viral swabs.  I spoke with son who is now at bedside, would like to focus on comfort, reemphasized DNR/DNI status, avoid heroic measures.  Workup as above is agreeable, will treat infections if they are diagnosed on workup today, with a goal of getting him back to his facility today.  Per son, his cognitive and physical decline has been subacute worsening over the past several weeks to months and his baseline now is very limited, not ambulatory, not very much interactive.  SO far w/u reveals CXR w suspected pulmonary edema - echo from 2022 shows severe decreased EF <20%. Clinical picture is mixed - no rales, no hypoxemia, but he does have bilateral pitting edema. Mucous membranes dry and skin turgor is poor w markedly decreased PO intake per son. Lactic slightly elevated.   Adding on BNP - hold on fluids and diuresis  for now.   --- BNP is elevated at 1000+ given his reduced EF, history of CHF, and evidence of pulmonary edema on exam today I will give diuresis with 20 of IV Lasix.  Given frailty and failure to thrive, lives at assisted living currently, will admit for pulmonary edema and failure to thrive, if patient is unable to return to his baseline functionality son would like this patient to be placed in skilled nursing facility for further care.       FINAL CLINICAL IMPRESSION(S) /  ED DIAGNOSES   Final diagnoses:  Altered mental status, unspecified altered mental status type  Acute pulmonary edema ([place])     Rx / DC Orders   ED Discharge Orders     None        Note:  This document was prepared using Dragon voice recognition software and may include unintentional dictation errors.    Lucillie Garfinkel, MD 06/06/22 [phone]

## 2022-06-06 NOTE — ED Triage Notes (Signed)
Pt via [facility] from [place]. Per staff, pt had a decreased LOC. EMS reports pt was recently discharged from the hospital. Staff states that he is usually up and moving around and was fine yesterday. On arrival, pt is lethargic but able to answer some questions. Pt is responsive to voice. Pt on 3L [place], unknown if pt wears O2 at baseline.  22 G L AC  ETCO2 15 RR 25 BP 126/80 HR 63  CBG 117

## 2022-06-06 NOTE — H&P (Signed)
History and Physical    Patient: [name] [name] VHQ:[id number] DOB: June 28, 1924 DOA: 06/06/2022 DOS: the patient was seen and examined on 06/06/2022 PCP: [facility]  Patient coming from: ALF/ILF  Chief Complaint:  Chief Complaint  Patient presents with   Altered Mental Status   HPI: [name] [name] is a 89 y.o. male with medical history significant of HFrEF with last EF of less than 20% with grade 3 diastolic dysfunction, sinus bradycardia, Hypothyroidism, depression, recurrent falls, who presents to the ED due to altered mental status.  History obtained from patient's son, [name] [name], at bedside due to patient's altered mental status.  [name] [name] states that for the last 1 month, [name] [name] has been gradually declining with worsening memory, worsening functionality, lack of appetite and subsequent weight loss.  He states that before 1 month ago, [name] [name] to be able to play board/card games with him and in the last few weeks, he has not been able to.  He has been losing weight.  [name] [name] worries that his father is suffering.   Per chart review, patient arrived from [place].  Staff noted decreased level of consciousness and lethargy.  Due to this, EMS was called.  ED course: On arrival to the ED, patient was normotensive at 118/82 with heart rate of 55.  He was saturating at 90% on 4 L of supplemental oxygen.  Initial workup remarkable for WBC of 4.7, lactic acid of 2.4, troponin of 27, BNP of 1059, bicarb of 19, creatinine 0.99 with GFR above 60.  COVID-19, influenza and RSV PCR negative.  TSH within normal limits. Chest x-ray was obtained that demonstrated new small to moderate-sized right-sided pleural effusion with bilateral interstitial opacities favored to be pulmonary edema.  CT of the head was obtained that demonstrated no acute intracranial abnormalities.  Patient was started on IV Lasix and [facility] contacted for admission.  Review of Systems: unable to review all systems due to the inability  of the patient to answer questions.  Past Medical History:  Diagnosis Date   Constipation    Hypothyroid    Major depressive disorder    Osteoarthritis (arthritis due to wear and tear of joints)    History reviewed. No pertinent surgical history.  Social History:  reports that he has never smoked. He has never used smokeless tobacco. He reports that he does not currently use alcohol. No history on file for drug use.  No Known Allergies  History reviewed. No pertinent family history.  Prior to Admission medications   Medication Sig Start Date End Date Taking? Authorizing Provider  acetaminophen (TYLENOL) 500 MG tablet Take 1,000 mg by mouth at bedtime. For knee pain.   Yes [provider]  aspirin 81 MG EC tablet Take 81 mg by mouth daily.   Yes [provider]  cefpodoxime (VANTIN) 200 MG tablet Take 200 mg by mouth 2 (two) times daily. One tablet every morning and at bedtime for 10 days for pneumonia   Yes [provider]  cetirizine (ZYRTEC) 10 MG tablet Take 10 mg by mouth daily. For allergies   Yes [provider]  cholecalciferol (VITAMIN D3) 10 MCG (400 UNIT) TABS tablet Take 800 Units by mouth daily.   Yes [provider]  clopidogrel (PLAVIX) 75 MG tablet Take 1 tablet (75 mg total) by mouth daily. 01/11/21  Yes Enedina Finner, MD  cyanocobalamin 1000 MCG tablet Take 1,000 mcg by mouth daily.   Yes [provider]  docusate sodium (COLACE) 100 MG capsule Take  100 mg by mouth daily.   Yes [provider]  DULoxetine (CYMBALTA) 30 MG capsule Take 30 mg by mouth daily. 05/07/22  Yes [provider]  ferrous sulfate 325 (65 FE) MG tablet Take 325 mg by mouth daily.   Yes [provider]  levothyroxine (SYNTHROID) 75 MCG tablet Take 50 mcg by mouth daily. Take on an empty stomach with a glass of water at least 30 to 60 minutes before breakfast.   Yes [provider]  losartan (COZAAR) 25 MG tablet Take  25 mg by mouth daily. 05/07/22  Yes [provider]  melatonin 3 MG TABS tablet Take 3 mg by mouth at bedtime.   Yes [provider]  nystatin powder Apply 1 Application topically every 12 (twelve) hours as needed.   Yes [provider]  omeprazole (PRILOSEC) 20 MG capsule Take 20 mg by mouth daily. In the morning.   Yes [provider]  polyethylene glycol powder (GLYCOLAX/MIRALAX) 17 GM/SCOOP powder Take 17 g by mouth daily.   Yes [provider]  senna-docusate (SENOKOT-S) 8.6-50 MG tablet Take 1 tablet by mouth daily. PRN per [facility]   Yes [provider]  sodium chloride 1 g tablet Take 1 g by mouth 2 (two) times daily.   Yes [provider]   Physical Exam: Vitals:   06/06/22 1400 06/06/22 1430 06/06/22 1500 06/06/22 1545  BP: 105/60 121/75 106/68 122/66  Pulse:   (!) 46   Resp: 14 (!) 22 17 (!) 21  SpO2: 97%  96%   Weight:      Height:       Physical Exam Vitals and nursing note reviewed.  Constitutional:      Appearance: He is cachectic. He is ill-appearing (Chronically).  HENT:     Head: Normocephalic and atraumatic.  Eyes:     Conjunctiva/sclera: Conjunctivae normal.     Pupils: Pupils are equal, round, and reactive to light.     Comments: Bilateral sunken eyes with temporal wasting  Cardiovascular:     Rate and Rhythm: Normal rate and regular rhythm.  Pulmonary:     Effort: Tachypnea present.     Breath sounds: Decreased breath sounds (Diminished breath sounds in the right middle and lower lung fields) present.  Abdominal:     General: Bowel sounds are normal.     Palpations: Abdomen is soft.  Musculoskeletal:     Right lower leg: 1+ Pitting Edema present.     Left lower leg: 1+ Pitting Edema present.  Skin:    General: Skin is warm.     Coloration: Skin is pale.  Neurological:     Mental Status: He is alert.     Comments:  Patient is alert and oriented to self but not situation.  Able to answer questions  appropriately    Data Reviewed: CBC with WBC of 4.7, hemoglobin of 14, and platelets of 100 CMP with sodium of 143, potassium 4.7, chloride 113, bicarb 19, glucose 82, BUN 43, creatinine 0.99, AST 42, ALT 27, total protein 6.1 and GFR above 60 Troponin elevated at 27, consistent with prior readings BNP elevated at 1059 Lactic acid elevated 2.4 TSH within normal limits at 4.272 COVID-19, influenza and RSV PCR negative  EKG personally reviewed.  Sinus arrhythmia with bradycardia with rate of 42.  Left bundle branch block.  Multiple PACs.  CT Head Wo Contrast  Result Date: 06/06/2022 CLINICAL DATA:  Mental status change, unknown cause EXAM: CT HEAD WITHOUT CONTRAST  TECHNIQUE: Contiguous axial images were obtained from the base of the skull through the vertex without intravenous contrast. RADIATION DOSE REDUCTION: This exam was performed according to the departmental dose-optimization program which includes automated exposure control, adjustment of the mA and/or kV according to patient size and/or use of iterative reconstruction technique. COMPARISON:  CT head 01/08/2021. FINDINGS: Brain: No evidence of acute infarction, hemorrhage, hydrocephalus, extra-axial collection or mass lesion/mass effect. Similar patchy white matter hypodensities, nonspecific but compatible with chronic microvascular ischemic disease. Similar cerebral atrophy. Vascular: No hyperdense vessel.  Calcific atherosclerosis. Skull: No acute fracture. Sinuses/Orbits: Largely clear sinuses.  No acute orbital findings. Other: No mastoid effusions.  Partial left mastoidectomy. IMPRESSION: No evidence of acute intracranial abnormality. Electronically Signed   By: Margaretha Sheffield M.D.   On: 06/06/2022 13:18   DG Chest Port 1 View  Result Date: 06/06/2022 CLINICAL DATA:  Questionable sepsis. EXAM: PORTABLE CHEST 1 VIEW COMPARISON:  CXR 05/28/22 FINDINGS: Compared to prior exam there is new small to moderate-sized right-sided pleural  effusion. There are persistent prominent bilateral interstitial opacities, favored to represent pulmonary edema. No new focal airspace opacity is visualized. Prominent cardiac contours, unchanged from prior exam. IMPRESSION: 1. New small to moderate-sized right-sided pleural effusion. 2. Persistent prominent bilateral interstitial opacities, favored to represent pulmonary edema. Electronically Signed   By: Marin Roberts M.D.   On: 06/06/2022 12:58    There are no new results to review at this time.  Assessment and Plan:  * End of life care Patient initially presented with increased lethargy found to have elevated BNP and pulmonary edema consistent with acute on chronic heart failure.  Patient's son at bedside feels that Mr. Yearwood has been gradually declining over the past 1 month, and he would not want to continue suffering.  He and his sister have decided that they would like comfort measures only to allow Mr. Amesquita to pass in peace.  - Patient is an established with a [facility] already; hospital liaison consulted for consideration of inpatient hospice - Morphine as needed for air hunger or pain.  Currently ordered every 2 hours as needed.  Advance to infusion if air hunger becomes uncontrolled. - Ativan as needed for anxiety - Discontinue home medications - Discontinue all monitoring labs and lines  Acute HFrEF (heart failure with reduced ejection fraction) ([place]) Patient has a history of HFrEF with last EF less than 20% in 2022 with a restrictive pattern.  Today, he is presenting with elevated BNP, pulmonary edema, right-sided pleural effusion and significant lower extremity edema consistent with acute on chronic heart failure exacerbation.  As noted above, comfort care measures are being pursued.  - Discontinue Lasix - Discontinue telemetry  Advance Care Planning: End-of-life care/comfort care only  Consults: [facility]  Family Communication: Patient's son updated at  bedside  Severity of Illness: The appropriate patient status for this patient is INPATIENT. Inpatient status is judged to be reasonable and necessary in order to provide the required intensity of service to ensure the patient's safety. The patient's presenting symptoms, physical exam findings, and initial radiographic and laboratory data in the context of their chronic comorbidities is felt to place them at high risk for further clinical deterioration. Furthermore, it is not anticipated that the patient will be medically stable for discharge from the hospital within 2 midnights of admission.   * I certify that at the point of admission it is my clinical judgment that the patient will require inpatient hospital care spanning beyond 2 midnights from  the point of admission due to high intensity of service, high risk for further deterioration and high frequency of surveillance required.*  Author: Jose Persia, MD 06/06/2022 5:21 PM  For on call review [email or web address].

## 2022-06-07 DIAGNOSIS — Z515 Encounter for palliative care: Secondary | ICD-10-CM | POA: Diagnosis not present

## 2022-06-07 NOTE — TOC Initial Note (Signed)
Transition of Care [facility]) - Initial/Assessment Note    Patient Details  Name: [name] [name] MRN: [id number] Date of Birth: 09/20/1924  [facility] of [facility]) CM/SW Contact:    Candie Chroman, LCSW Phone Number: 06/07/2022, 10:27 AM  Clinical Narrative:   Patient not oriented. No family at bedside. Called son, introduced role, and inquired about interest in hospice facility. Son confirmed and prefers [facility]. Referral made to Doreatha Lew, RN. No further concerns. CSW encouraged patient's son to contact CSW as needed. CSW will continue to follow patient for support and facilitate discharge once disposition determined.               Expected Discharge Plan: Hospice Medical Facility Barriers to Discharge: Other (must enter comment) ([facility] reviewing referral for eligibility)   Patient Goals and CMS Choice     Choice offered to / list presented to : [facility] POA / Guardian, Adult Children      Expected Discharge Plan and Services     Post Acute Care Choice: Hospice Living arrangements for the past 2 months: [place]                                      Prior Living Arrangements/Services Living arrangements for the past 2 months: [place] Lives with:: Facility Resident Patient language and need for interpreter reviewed:: Yes        Need for Family Participation in Patient Care: Yes (Comment) Care giver support system in place?: Yes (comment) Current home services: Home RN Criminal Activity/Legal Involvement Pertinent to Current Situation/Hospitalization: No - Comment as needed  Activities of Daily Living Home Assistive Devices/Equipment: Environmental consultant (specify type) ADL Screening (condition at time of admission) Patient's cognitive ability adequate to safely complete daily activities?: No Is the patient deaf or have difficulty hearing?: No Does the patient have difficulty seeing, even when wearing glasses/contacts?: No Does the patient  have difficulty concentrating, remembering, or making decisions?: Yes Patient able to express need for assistance with ADLs?: No Does the patient have difficulty dressing or bathing?: Yes Independently performs ADLs?: No Communication: Independent Dressing (OT): Dependent Is this a change from baseline?: Pre-admission baseline Grooming: Dependent Is this a change from baseline?: Pre-admission baseline Feeding: Dependent Is this a change from baseline?: Change from baseline, expected to last <3 days Bathing: Dependent Is this a change from baseline?: Pre-admission baseline Toileting: Dependent Is this a change from baseline?: Pre-admission baseline In/Out Bed: Dependent Is this a change from baseline?: Change from baseline, expected to last <3 days Walks in Home: Dependent Is this a change from baseline?: Change from baseline, expected to last >3 days Does the patient have difficulty walking or climbing stairs?: Yes Weakness of Legs: Both Weakness of Arms/Hands: Both  Permission Sought/Granted Permission sought to share information with : Facility Sport and exercise psychologist, Family Supports    Share Information with NAME: [name] [name]  Permission granted to share info w AGENCY: [facility]  Permission granted to share info w Relationship: Son/HCPOA  Permission granted to share info w Contact Information: [phone]  Emotional Assessment   Attitude/Demeanor/Rapport: Unable to Assess Affect (typically observed): Unable to Assess Orientation: : Oriented to Self Alcohol / Substance Use: Not Applicable Psych Involvement: No (comment)  Admission diagnosis:  Acute pulmonary edema ([place]) [J81.0] End of life care [Z51.5] Altered mental status, unspecified altered mental status type [R41.82] Acute HFrEF (heart failure with reduced ejection fraction) ([place]) [I50.21]  Patient Active Problem List   Diagnosis Date Noted   Acute HFrEF (heart failure with reduced ejection fraction) ([place])  06/06/2022   End of life care 06/06/2022   Pressure injury of skin 01/10/2021   Sinus bradycardia 01/09/2021   Multiple falls 01/09/2021   AKI (acute kidney injury) ([place]) 01/09/2021   Rib fracture 01/09/2021   Elevated troponin 01/08/2021   PCP:  [facility] Pharmacy:   [address] [place] [address] Phone: [phone] Fax: [phone]     Social Determinants of Health (SDOH) Social History: SDOH Screenings   Food Insecurity: No Food Insecurity (06/06/2022)  Housing: Low Risk  (06/06/2022)  Transportation Needs: No Transportation Needs (06/06/2022)  Utilities: Not At Risk (06/06/2022)  Tobacco Use: Low Risk  (06/06/2022)   SDOH Interventions:     Readmission Risk Interventions     No data to display

## 2022-06-07 NOTE — Progress Notes (Signed)
Nutrition Brief Note  Chart reviewed. Pt now transitioning to comfort care.  No further nutrition interventions planned at this time.  Please re-consult as needed.   Elliett Guarisco W, RD, LDN, CDCES Registered Dietitian II Certified Diabetes Care and Education Specialist Please refer to [facility] for RD and/or RD on-call/weekend/after hours pager   

## 2022-06-07 NOTE — TOC Transition Note (Signed)
Transition of Care [facility]) - CM/SW Discharge Note   Patient Details  Name: [name] [name] MRN: [id number] Date of Birth: May 30, 1924  Transition of Care [facility]) CM/SW Contact:  Candie Chroman, LCSW Phone Number: 06/07/2022, 2:38 PM   Clinical Narrative:  Patient has orders to discharge to [facility] today. RN will call report to [phone]. [facility] liaison set up EMS transport for 7:00 pm. No further concerns. CSW signing off.   Final next level of care: [place] Barriers to Discharge: Barriers Resolved   Patient Goals and CMS Choice   Choice offered to / list presented to : [facility] POA / Guardian, Adult Children  Discharge Placement                Patient chooses bed at: Other - please specify in the comment section below: [facility]) Patient to be transferred to facility by: EMS   Patient and family notified of of transfer: 06/07/22  Discharge Plan and Services Additional resources added to the After Visit Summary for       Post Acute Care Choice: Hospice                               Social Determinants of Health (SDOH) Interventions SDOH Screenings   Food Insecurity: No Food Insecurity (06/06/2022)  Housing: Low Risk  (06/06/2022)  Transportation Needs: No Transportation Needs (06/06/2022)  Utilities: Not At Risk (06/06/2022)  Tobacco Use: Low Risk  (06/06/2022)     Readmission Risk Interventions     No data to display

## 2022-06-07 NOTE — Progress Notes (Signed)
Manufacturing engineer [facility])  Nurse Liaison Note  New referral for Hospice InPatient Unit [facility]) received from [place] Scrape, [place] with patient's son, Chauncey Reading, Merel Santoli, (also his wife), and patient's daughter Robie Mcniel, [place] to discuss referral.  Explanation of hospice philosophy, hospice benefits and IPU discussed with all.  All are in agreement with and request transfer to the [place] for management of his current uncontrolled pain and for end of life care.    Spoke with Dr. Antonieta Loveless, hospice physician to discuss patient's case.  Patient has been approved for GIP level of care, requiring frequent doses of IV morphine to manage his pain.  Bed offer made and accepted by family. Jeneen Rinks, son will go to the hospice home to sign consents.  Transport can be arranged for 1900 pick up this evening.  Please make sure DNR and discharge summary accompany the patient.  Floor RN please call report to [phone] or [phone].  Will continue to follow through final disposition.  Thank you for allowing participation in this patient's care.  Dimas Aguas, RN Nurse Liaison [phone]

## 2022-06-07 NOTE — Discharge Summary (Signed)
[facility] Discharge Summary   Patient: [name] [name] XBJ:[id number]  PCP: [name] [name], [facility]  Date of admission: 06/06/2022   Date of discharge:  06/07/2022     Discharge Diagnoses:  Principal Problem:   End of life care Active Problems:   Acute HFrEF (heart failure with reduced ejection fraction) (HCC)   Admitted From: Home Disposition:  Hospice facility  Recommendations for Outpatient Follow-up:  Follow with hospice care Follow up LABS/TEST:  None   Diet recommendation: NPO at risk for aspiration, May advance diet as per tolerance.  Activity: The patient is advised to gradually reintroduce usual activities, as tolerated  Discharge Condition: stable  Code Status: DNR   History of present illness: As per the H and P dictated on admission  Hospital Course:   [name] [name] is a 89 y.o. male with medical history significant of HFrEF with last EF of less than 20% with grade 3 diastolic dysfunction, sinus bradycardia, Hypothyroidism, depression, recurrent falls, who presents to the ED due to altered mental status.  History obtained from patient's son, [name] [name], at bedside due to patient's altered mental status.   [name] [name] states that for the last 1 month, [name] [name] has been gradually declining with worsening memory, worsening functionality, lack of appetite and subsequent weight loss.  He states that before 1 month ago, [name] [name] to be able to play board/card games with him and in the last few weeks, he has not been able to.  He has been losing weight.  [name] [name] worries that his father is suffering.    Per chart review, patient arrived from [facility].  Staff noted decreased level of consciousness and lethargy.  Due to this, EMS was called.   ED course: On arrival to the ED, patient was normotensive at 118/82 with heart rate of 55.  He was saturating at 90% on 4 L of supplemental oxygen.  Initial workup remarkable for WBC of 4.7, lactic acid of 2.4, troponin of 27, BNP of 1059,  bicarb of 19, creatinine 0.99 with GFR above 60.  COVID-19, influenza and RSV PCR negative.  TSH within normal limits. Chest x-ray was obtained that demonstrated new small to moderate-sized right-sided pleural effusion with bilateral interstitial opacities favored to be pulmonary edema.  CT of the head was obtained that demonstrated no acute intracranial abnormalities.  Patient was started on IV Lasix and TRH contacted for admission.  Assessment and Plan: # End of life care Patient initially presented with increased lethargy found to have elevated BNP and pulmonary edema consistent with acute on chronic heart failure.  Patient's son at bedside feels that [name] [name] has been gradually declining over the past 1 month, and he would not want to continue suffering.  He and his sister have decided that they would like comfort measures only to allow [name] [name] to pass in peace. - Patient is an established with a [facility] already; hospital liaison consulted for consideration of inpatient hospice S/p Morphine as needed for air hunger or pain. ordered every 2 hours as needed.  Advance to infusion if air hunger becomes uncontrolled. S/p Ativan as needed for anxiety, Discontinue home medications, Discontinue all monitoring labs. Keep IV lock for IV medication. # Urine retention, Foley catheter was inserted for comfort. # Acute HFrEF (heart failure with reduced ejection fraction) Patient has a history of HFrEF with last EF less than 20% in 2022 with a restrictive pattern.   presented with elevated BNP, pulmonary edema, right-sided pleural effusion and significant lower extremity edema consistent with  acute on chronic heart failure exacerbation.  As noted above, comfort care measures are being pursued. Discontinued Lasix and Discontinued telemetry and Discontinued all home medications Advance Care Planning: End-of-life care/comfort care only Consults: [facility] Family Communication: Patient's son updated at  bedside on admission  Body mass index is 26.82 kg/m.  Nutrition Interventions:   Pressure Injury 06/06/22 Coccyx Right Stage 2 -  Partial thickness loss of dermis presenting as a shallow open injury with a red, pink wound bed without slough. small circular area (Active)  06/06/22 1954  Location: Coccyx  Location Orientation: Right  Staging: Stage 2 -  Partial thickness loss of dermis presenting as a shallow open injury with a red, pink wound bed without slough.  Wound Description (Comments): small circular area  Present on Admission:   Dressing Type Foam - Lift dressing to assess site every shift 06/07/22 4098     Patient is appropriate to discharge under hospice care management for need of life care.  Consultants: Hospice Procedures: None  Discharge Exam: General: Appear in no distress, resting comfortably, no Rash; Oral Mucosa dry  Cardiovascular: S1 and S2 Present, no Murmur, Respiratory: normal respiratory effort, Bilateral Air entry present and no Crackles, no wheezes Abdomen: Bowel Sound present, Soft and no tenderness, no hernia Extremities: mild Pedal edema, no calf tenderness Neurology: AAOx zero, sleepy, resting comfortably.   Filed Weights   06/06/22 1235  Weight: 80 kg   Vitals:   06/07/22 0400 06/07/22 0807  BP: 111/63 109/62  Pulse: (!) 56 (!) 54  Resp: 14 16  Temp: (!) 97 F (36.1 C) 97.9 F (36.6 C)  SpO2: 96% 96%    DISCHARGE MEDICATION: Allergies as of 06/07/2022   No Known Allergies      Medication List     STOP taking these medications    acetaminophen 500 MG tablet Commonly known as: TYLENOL   aspirin EC 81 MG tablet   cefpodoxime 200 MG tablet Commonly known as: VANTIN   cetirizine 10 MG tablet Commonly known as: ZYRTEC   cholecalciferol 10 MCG (400 UNIT) Tabs tablet Commonly known as: VITAMIN D3   clopidogrel 75 MG tablet Commonly known as: PLAVIX   cyanocobalamin 1000 MCG tablet   docusate sodium 100 MG capsule Commonly  known as: COLACE   DULoxetine 30 MG capsule Commonly known as: CYMBALTA   ferrous sulfate 325 (65 FE) MG tablet   levothyroxine 75 MCG tablet Commonly known as: SYNTHROID   losartan 25 MG tablet Commonly known as: COZAAR   melatonin 3 MG Tabs tablet   nystatin powder Generic drug: nystatin   omeprazole 20 MG capsule Commonly known as: PRILOSEC   polyethylene glycol powder 17 GM/SCOOP powder Commonly known as: GLYCOLAX/MIRALAX   senna-docusate 8.6-50 MG tablet Commonly known as: Senokot-S   sodium chloride 1 g tablet       No Known Allergies Discharge Instructions     Call MD for:  severe uncontrolled pain   Complete by: As directed    Discharge instructions   Complete by: As directed    Follow-up with hospice care   Increase activity slowly   Complete by: As directed    No wound care   Complete by: As directed        The results of significant diagnostics from this hospitalization (including imaging, microbiology, ancillary and laboratory) are listed below for reference.    Significant Diagnostic Studies: CT Head Wo Contrast  Result Date: 06/06/2022 CLINICAL DATA:  Mental status change, unknown cause  EXAM: CT HEAD WITHOUT CONTRAST TECHNIQUE: Contiguous axial images were obtained from the base of the skull through the vertex without intravenous contrast. RADIATION DOSE REDUCTION: This exam was performed according to the departmental dose-optimization program which includes automated exposure control, adjustment of the mA and/or kV according to patient size and/or use of iterative reconstruction technique. COMPARISON:  CT head 01/08/2021. FINDINGS: Brain: No evidence of acute infarction, hemorrhage, hydrocephalus, extra-axial collection or mass lesion/mass effect. Similar patchy white matter hypodensities, nonspecific but compatible with chronic microvascular ischemic disease. Similar cerebral atrophy. Vascular: No hyperdense vessel.  Calcific atherosclerosis. Skull:  No acute fracture. Sinuses/Orbits: Largely clear sinuses.  No acute orbital findings. Other: No mastoid effusions.  Partial left mastoidectomy. IMPRESSION: No evidence of acute intracranial abnormality. Electronically Signed   By: Margaretha Sheffield M.D.   On: 06/06/2022 13:18   DG Chest Port 1 View  Result Date: 06/06/2022 CLINICAL DATA:  Questionable sepsis. EXAM: PORTABLE CHEST 1 VIEW COMPARISON:  CXR 05/28/22 FINDINGS: Compared to prior exam there is new small to moderate-sized right-sided pleural effusion. There are persistent prominent bilateral interstitial opacities, favored to represent pulmonary edema. No new focal airspace opacity is visualized. Prominent cardiac contours, unchanged from prior exam. IMPRESSION: 1. New small to moderate-sized right-sided pleural effusion. 2. Persistent prominent bilateral interstitial opacities, favored to represent pulmonary edema. Electronically Signed   By: Marin Roberts M.D.   On: 06/06/2022 12:58   DG Chest Port 1 View  Result Date: 05/28/2022 CLINICAL DATA:  Increased confusion.  Sacral decubitus ulcer EXAM: PORTABLE CHEST 1 VIEW COMPARISON:  Two-view x-ray 01/08/2021 FINDINGS: Enlarged cardiopericardial silhouette with underinflation. Kyphotic x-ray obscures the apices particularly on the left. There is increasing lung base parenchymal opacities with some interstitial changes. Acute infiltrates possible. Recommend follow-up. No pneumothorax or effusion. Degenerative changes. IMPRESSION: Underinflation with developing lung base parenchymal opacities. Atelectasis versus infiltrate. Secondary interstitial changes as well. Recommend follow-up Enlarged heart.  Kyphotic x-ray Electronically Signed   By: Jill Side M.D.   On: 05/28/2022 17:04    Microbiology: Recent Results (from the past 240 hour(s))  Urine Culture     Status: Abnormal   Collection Time: 05/28/22  7:05 PM   Specimen: Urine, Random  Result Value Ref Range Status   Specimen Description    Final    URINE, RANDOM Performed at [facility], [address], [place], [place] [address]    Special Requests   Final    NONE Performed at [facility], [address] [place], [place] [address]    Culture MULTIPLE SPECIES PRESENT, SUGGEST RECOLLECTION (A)  Final   Report Status 05/30/2022 FINAL  Final  Resp panel by RT-PCR (RSV, Flu A&B, Covid) Anterior Nasal Swab     Status: None   Collection Time: 06/06/22 12:47 PM   Specimen: Anterior Nasal Swab  Result Value Ref Range Status   SARS Coronavirus 2 by RT PCR NEGATIVE NEGATIVE Final    Comment: (NOTE) SARS-CoV-2 target nucleic acids are NOT DETECTED.  The SARS-CoV-2 RNA is generally detectable in upper respiratory specimens during the acute phase of infection. The lowest concentration of SARS-CoV-2 viral copies this assay can detect is 138 copies/mL. A negative result does not preclude SARS-Cov-2 infection and should not be used as the sole basis for treatment or other patient management decisions. A negative result may occur with  improper specimen collection/handling, submission of specimen other than nasopharyngeal swab, presence of viral mutation(s) within the areas targeted by this assay, and inadequate number  of viral copies(<138 copies/mL). A negative result must be combined with clinical observations, patient history, and epidemiological information. The expected result is Negative.  Fact Sheet for Patients:  EntrepreneurPulse.com.au  Fact Sheet for Healthcare Providers:  IncredibleEmployment.be  This test is no t yet approved or cleared by the [place] FDA and  has been authorized for detection and/or diagnosis of SARS-CoV-2 by FDA under an Emergency Use Authorization (EUA). This EUA will remain  in effect (meaning this test can be used) for the duration of the COVID-19 declaration under Section 564(b)(1) of the Act, 21 U.S.C.section 360bbb-3(b)(1),  unless the authorization is terminated  or revoked sooner.       Influenza A by PCR NEGATIVE NEGATIVE Final   Influenza B by PCR NEGATIVE NEGATIVE Final    Comment: (NOTE) The Xpert Xpress SARS-CoV-2/FLU/RSV plus assay is intended as an aid in the diagnosis of influenza from Nasopharyngeal swab specimens and should not be used as a sole basis for treatment. Nasal washings and aspirates are unacceptable for Xpert Xpress SARS-CoV-2/FLU/RSV testing.  Fact Sheet for Patients: EntrepreneurPulse.com.au  Fact Sheet for Healthcare Providers: IncredibleEmployment.be  This test is not yet approved or cleared by the [place] FDA and has been authorized for detection and/or diagnosis of SARS-CoV-2 by FDA under an Emergency Use Authorization (EUA). This EUA will remain in effect (meaning this test can be used) for the duration of the COVID-19 declaration under Section 564(b)(1) of the Act, 21 U.S.C. section 360bbb-3(b)(1), unless the authorization is terminated or revoked.     Resp Syncytial Virus by PCR NEGATIVE NEGATIVE Final    Comment: (NOTE) Fact Sheet for Patients: EntrepreneurPulse.com.au  Fact Sheet for Healthcare Providers: IncredibleEmployment.be  This test is not yet approved or cleared by the [place] FDA and has been authorized for detection and/or diagnosis of SARS-CoV-2 by FDA under an Emergency Use Authorization (EUA). This EUA will remain in effect (meaning this test can be used) for the duration of the COVID-19 declaration under Section 564(b)(1) of the Act, 21 U.S.C. section 360bbb-3(b)(1), unless the authorization is terminated or revoked.  Performed at [facility], [address] [place], [place] [address]      Labs: CBC: Recent Labs  Lab 06/06/22 1247  WBC 4.7  NEUTROABS 3.6  HGB 14.0  HCT 44.6  MCV 103.5*  PLT 774*   Basic Metabolic Panel: Recent Labs   Lab 06/06/22 1247  NA 143  K 4.7  CL 113*  CO2 19*  GLUCOSE 82  BUN 43*  CREATININE 0.99  CALCIUM 10.0   Liver Function Tests: Recent Labs  Lab 06/06/22 1247  AST 42*  ALT 27  ALKPHOS 42  BILITOT 1.2  PROT 6.1*  ALBUMIN 3.9   No results for input(s): "LIPASE", "AMYLASE" in the last 168 hours. No results for input(s): "AMMONIA" in the last 168 hours. Cardiac Enzymes: No results for input(s): "CKTOTAL", "CKMB", "CKMBINDEX", "TROPONINI" in the last 168 hours. BNP (last 3 results) Recent Labs    06/06/22 1247  BNP 1,059.6*   CBG: No results for input(s): "GLUCAP" in the last 168 hours.  Time spent: 35 minutes  Signed:  Val Riles  [facility] 06/07/2022 1:38 PM

## 2022-06-07 NOTE — Progress Notes (Signed)
Report given to Davanea, RN at hospice home. All questions answered.

## 2022-07-05 DEATH — deceased

## 2023-02-01 IMAGING — CT CT HEAD W/O CM
3 series · 15 of 47 positions shown, 18 images · non-contrast
Comparison: None.

CLINICAL DATA: Head trauma

EXAM:
CT HEAD WITHOUT CONTRAST
CT CERVICAL SPINE WITHOUT CONTRAST
TECHNIQUE: Multidetector CT imaging of the head and cervical spine was
performed following the standard protocol without intravenous
contrast. Multiplanar CT image reconstructions of the cervical spine
were also generated.

[Series 2: head wo · axial · 0.48mm/px · z∈[+210,+350]mm · 9 of 34 slices shown, 12 images]
[im 3/34  brain]
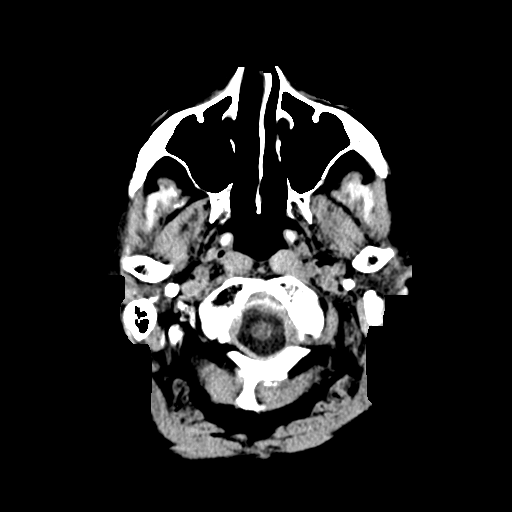
[im 3/34  bone]
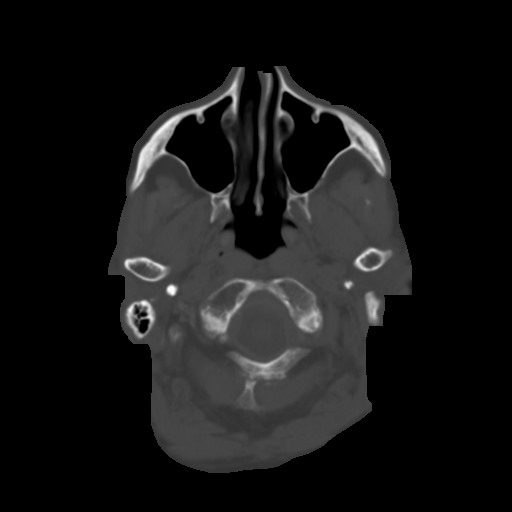
[im 6/34  brain]
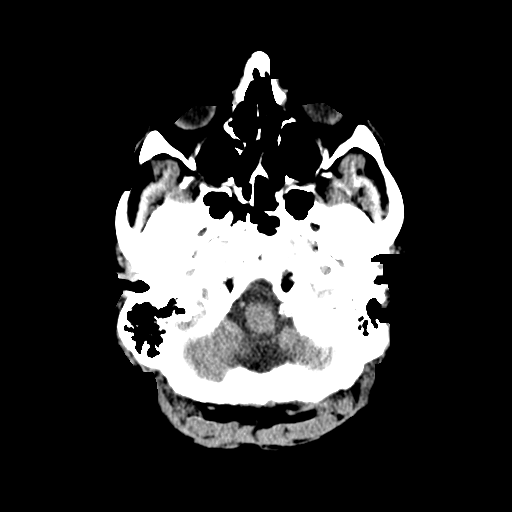
[im 10/34  brain]
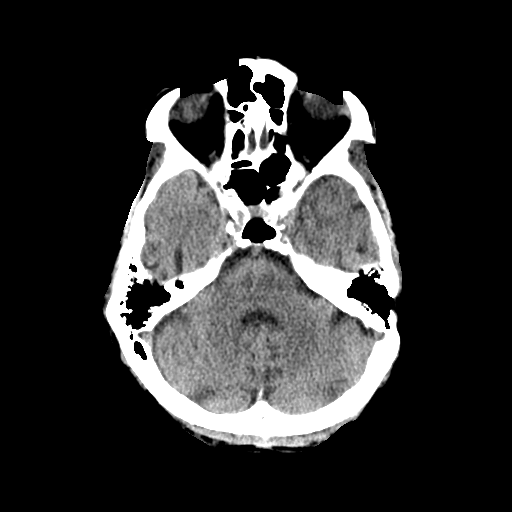
[im 13/34  brain]
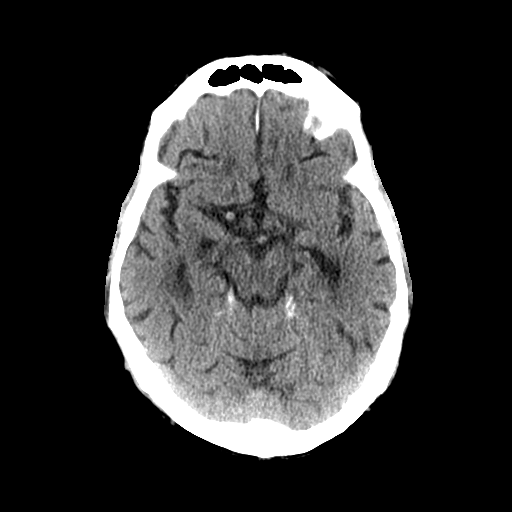
[im 18/34  brain]
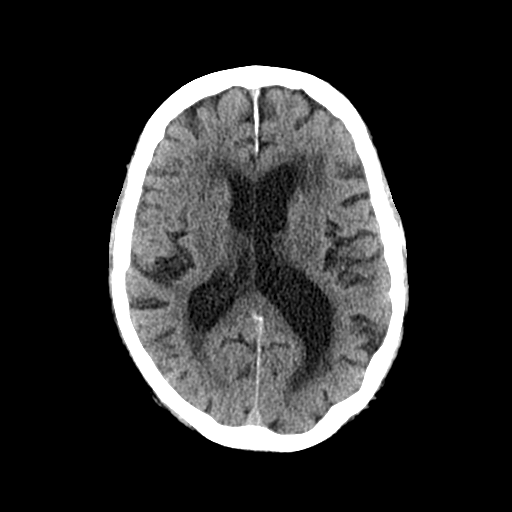
[im 18/34  bone]
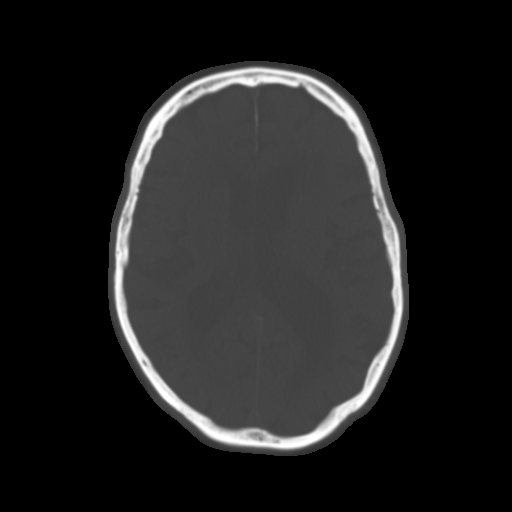
[im 21/34  brain]
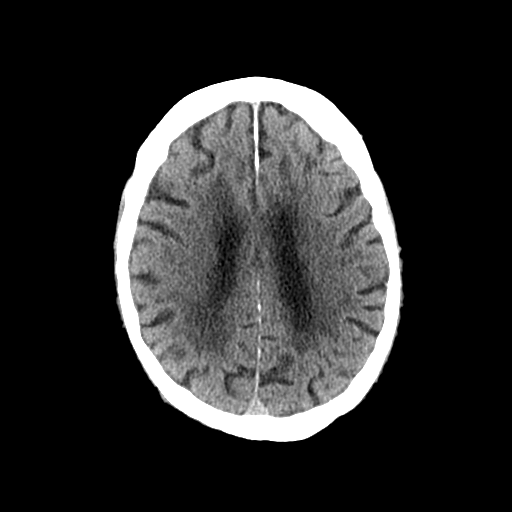
[im 24/34  brain]
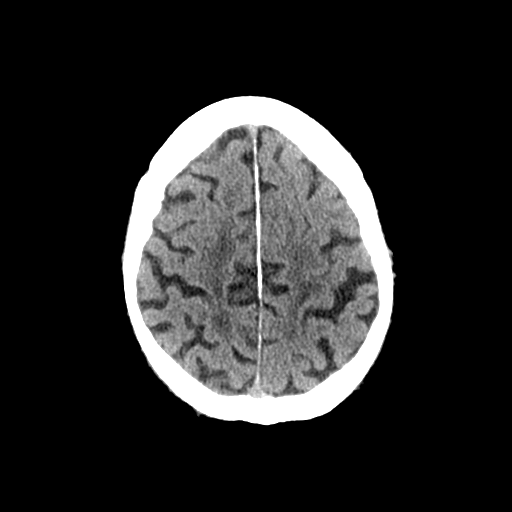
[im 28/34  brain]
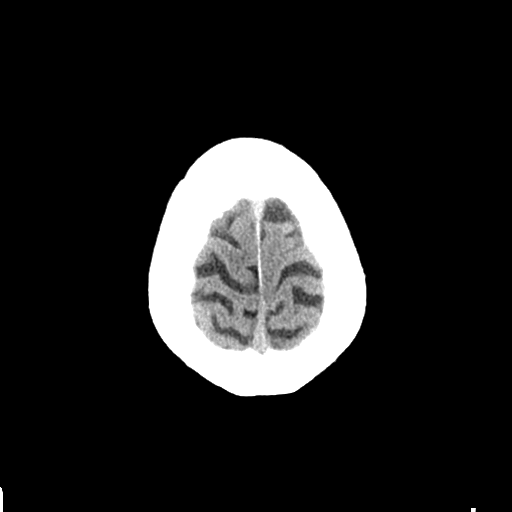
[im 31/34  brain]
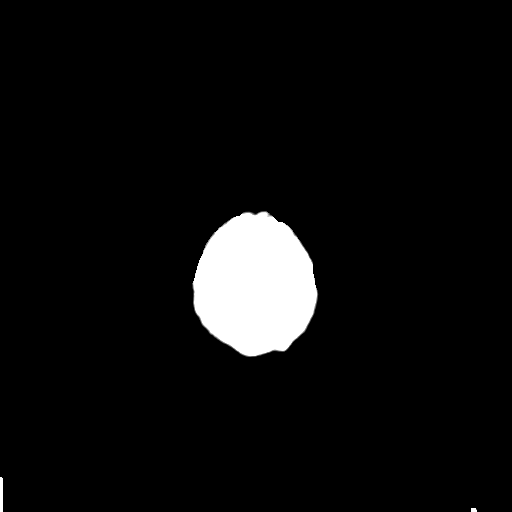
[im 31/34  bone]
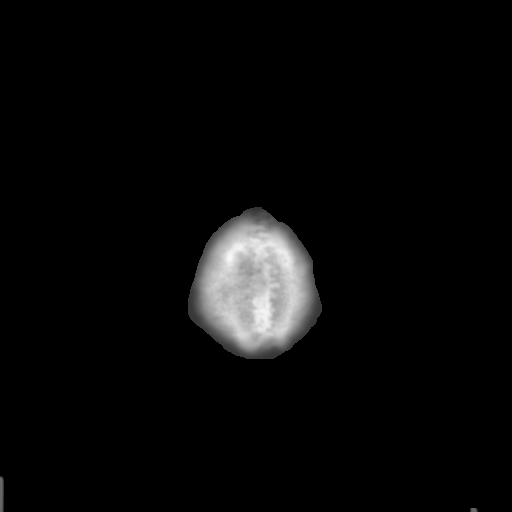

[Series 4: coronal soft tissue · coronal · 0.35mm/px · 3 of 74 slices shown]
[im 25/74  brain]
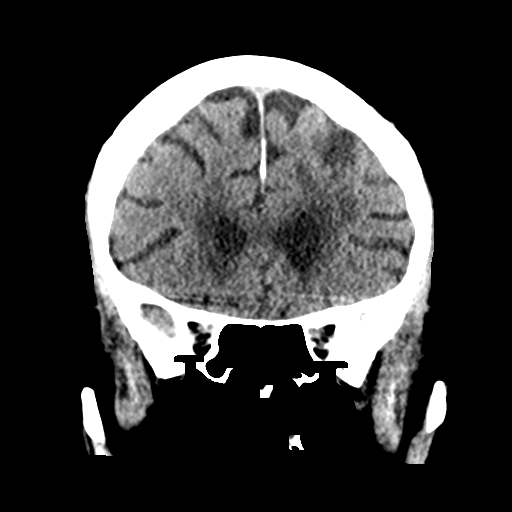
[im 33/74  brain]
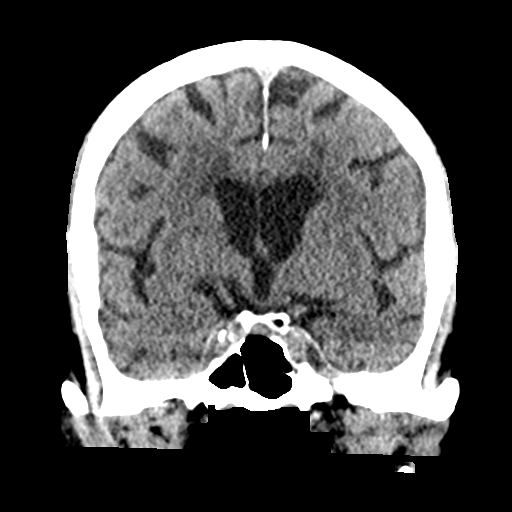
[im 41/74  brain]
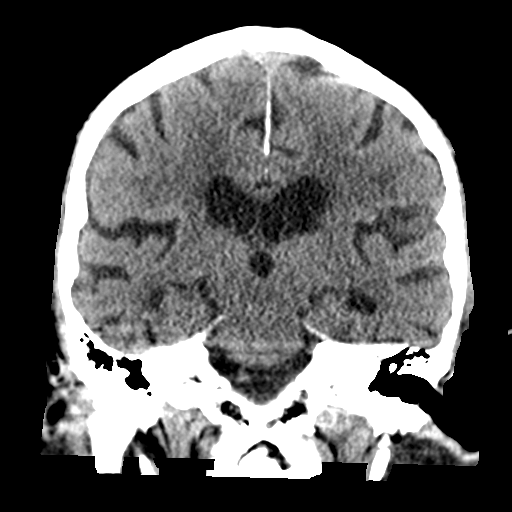

[Series 5: sagittal soft tissue · sagittal · 0.34mm/px · 3 of 60 slices shown]
[im 20/60  brain]
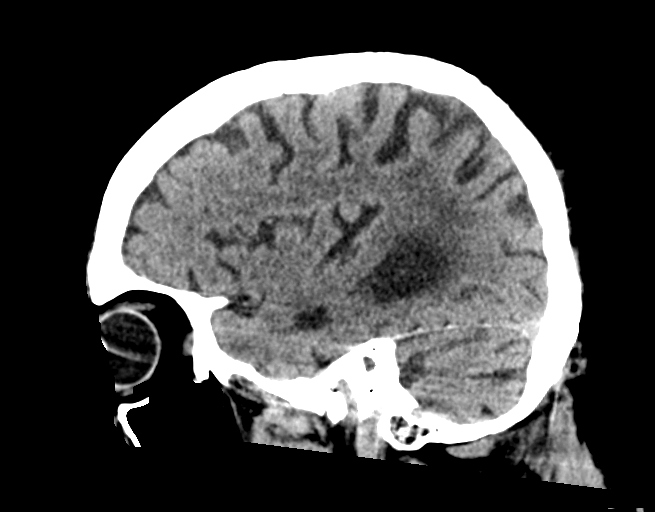
[im 30/60  brain]
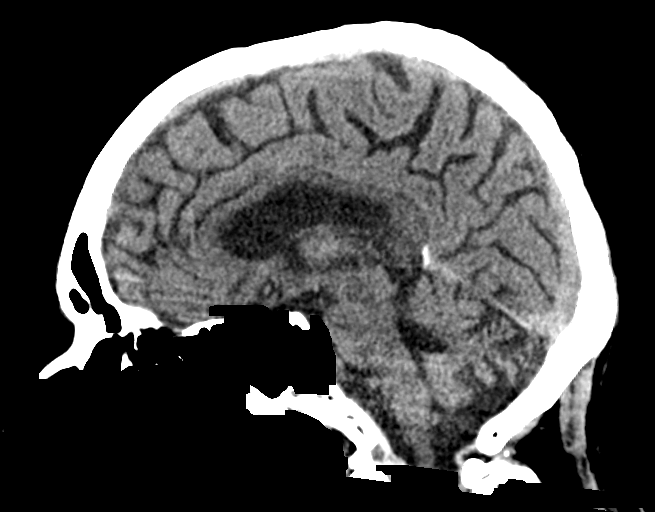
[im 40/60  brain]
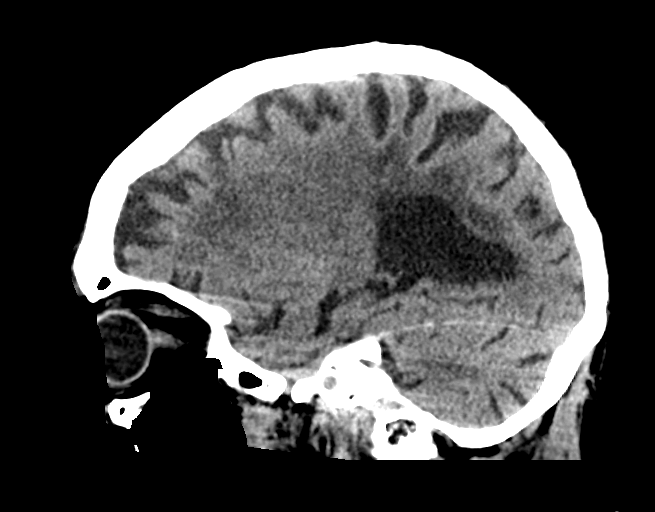

[15 of 47 positions shown; findings below may reference images not displayed]

FINDINGS: CT HEAD FINDINGS

Brain: No acute territorial infarction, hemorrhage or intracranial
mass. Moderate atrophy. Moderate hypodensity in the white matter
consistent with chronic small vessel ischemic change. Chronic
appearing lacunar infarct in the left basal ganglia. Nonenlarged
ventricles

Vascular: No hyperdense vessels.  Carotid vascular calcification

Skull: Normal. Negative for fracture or focal lesion.

Sinuses/Orbits: No acute finding.

Other: None

CT CERVICAL SPINE FINDINGS

Alignment: Generalized straightening. 6 mm anterolisthesis C4 on C5.
Facet alignment is maintained.

Skull base and vertebrae: No acute fracture. No primary bone lesion
or focal pathologic process.

Soft tissues and spinal canal: No prevertebral fluid or swelling. No
visible canal hematoma.

Disc levels: Advanced degenerative changes throughout the cervical
spine with multiple level disc space narrowing osteophyte.
Hypertrophic facet degenerative changes at multiple levels with
foraminal narrowing.

Upper chest: Apical scarring with calcifications on the right.

Other: None
IMPRESSION: 1. No CT evidence for acute intracranial abnormality. Atrophy and
chronic small vessel ischemic changes of the white matter.
2. Straightening of the cervical spine with 6 mm anterolisthesis C4
on C5. Advanced degenerative changes throughout the cervical spine.
No acute fracture is seen

## 2023-02-01 IMAGING — CT CT CERVICAL SPINE W/O CM
2 series · 12 of 28 positions shown, 15 images · non-contrast
Comparison: None.

CLINICAL DATA: Head trauma

EXAM:
CT HEAD WITHOUT CONTRAST
CT CERVICAL SPINE WITHOUT CONTRAST
TECHNIQUE: Multidetector CT imaging of the head and cervical spine was
performed following the standard protocol without intravenous
contrast. Multiplanar CT image reconstructions of the cervical spine
were also generated.

[Series 3: c spine soft · axial · 0.43mm/px · z∈[+84,+234]mm · 7 of 89 slices shown, 9 images]
[im 7/89  soft-tissue]
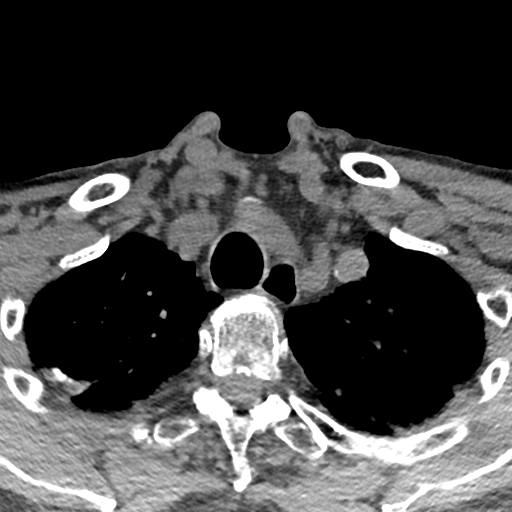
[im 7/89  bone]
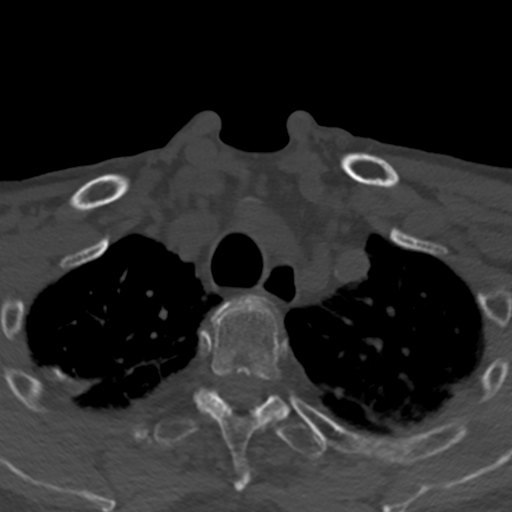
[im 21/89  bone]
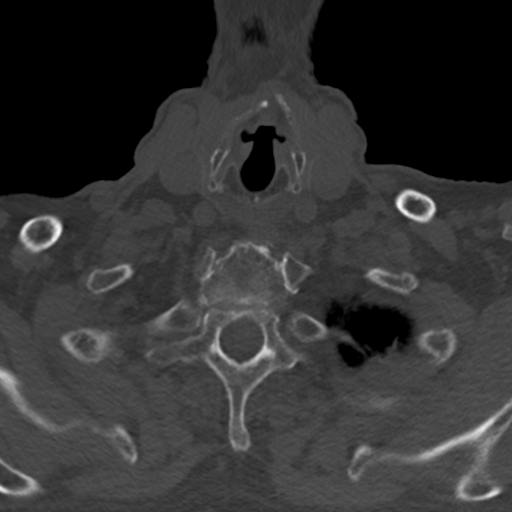
[im 34/89  bone]
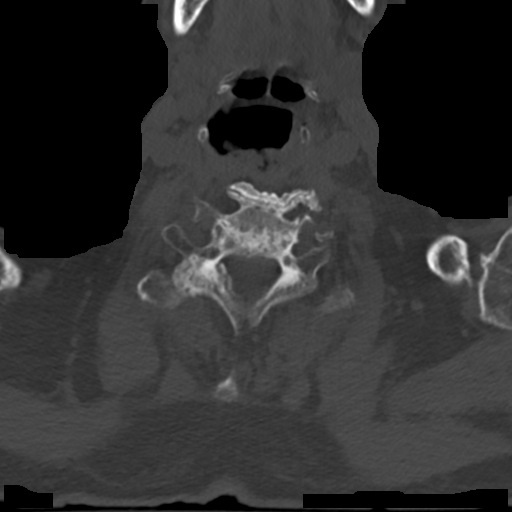
[im 48/89  bone]
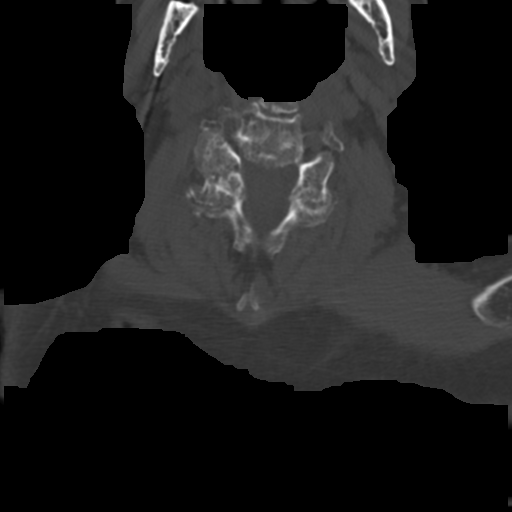
[im 55/89  soft-tissue]
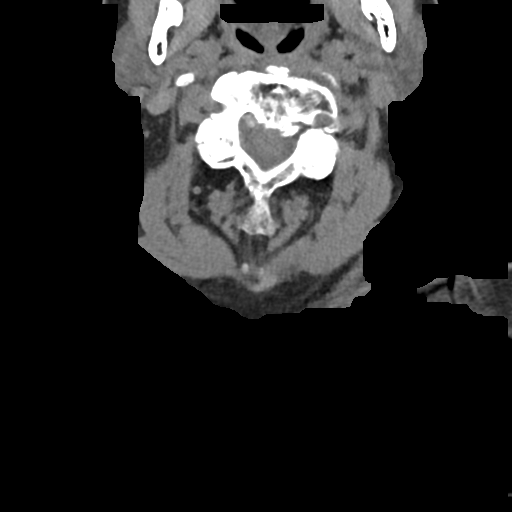
[im 55/89  bone]
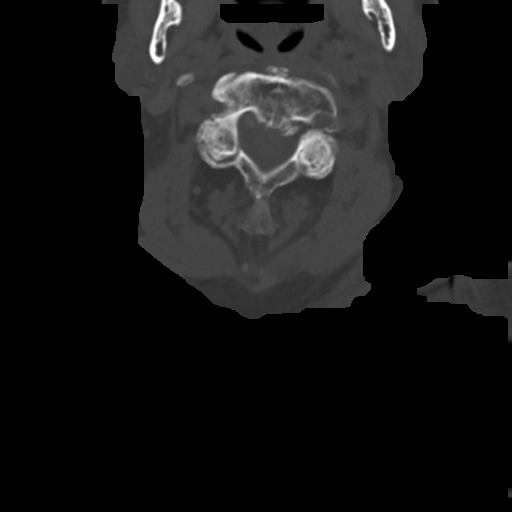
[im 68/89  bone]
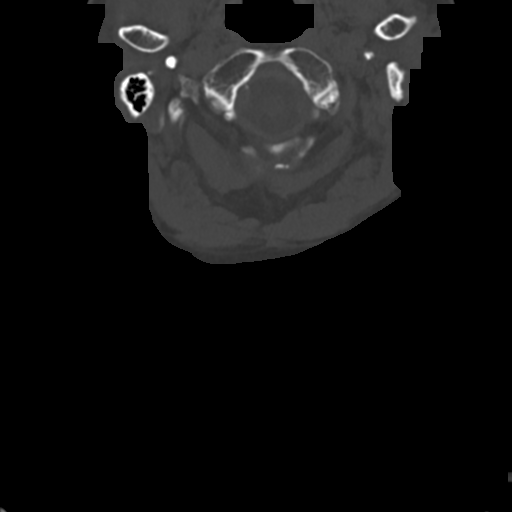
[im 82/89  bone]
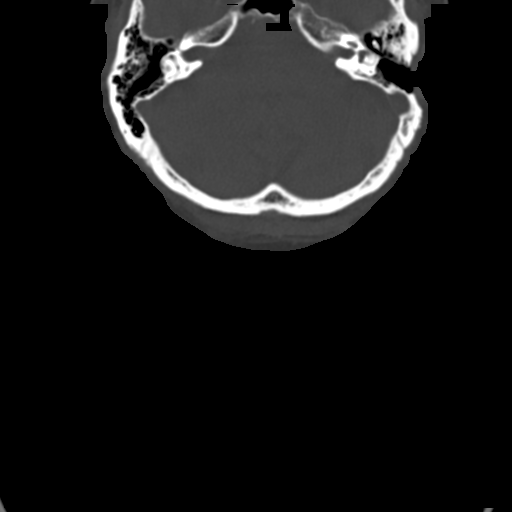

[Series 7: sagittal bone · sagittal · 0.31mm/px · 5 of 65 slices shown, 6 images]
[im 22/65  bone]
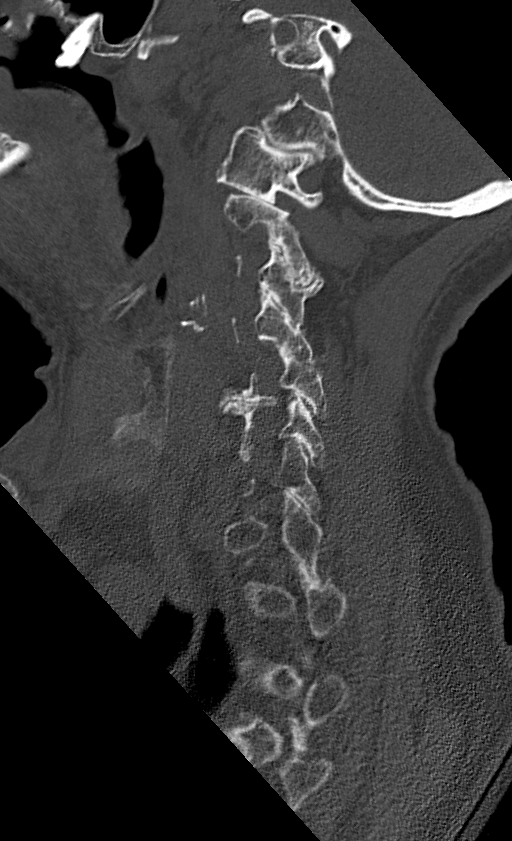
[im 27/65  bone]
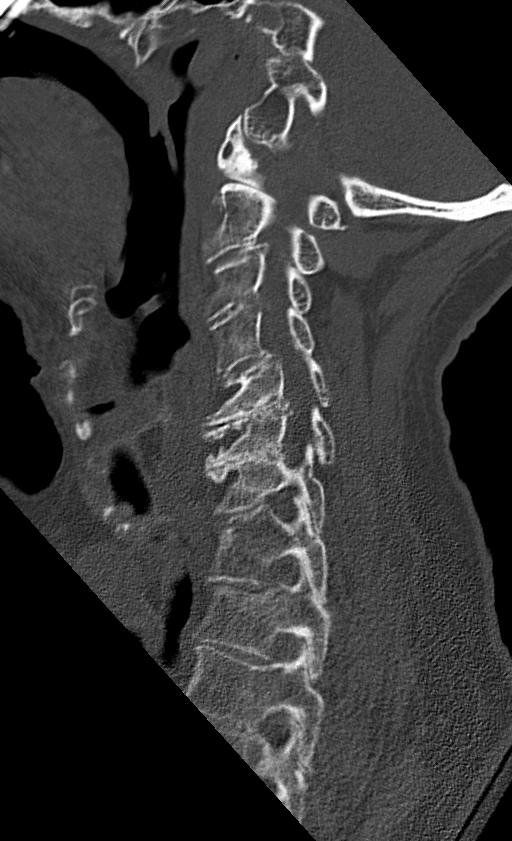
[im 33/65  soft-tissue]
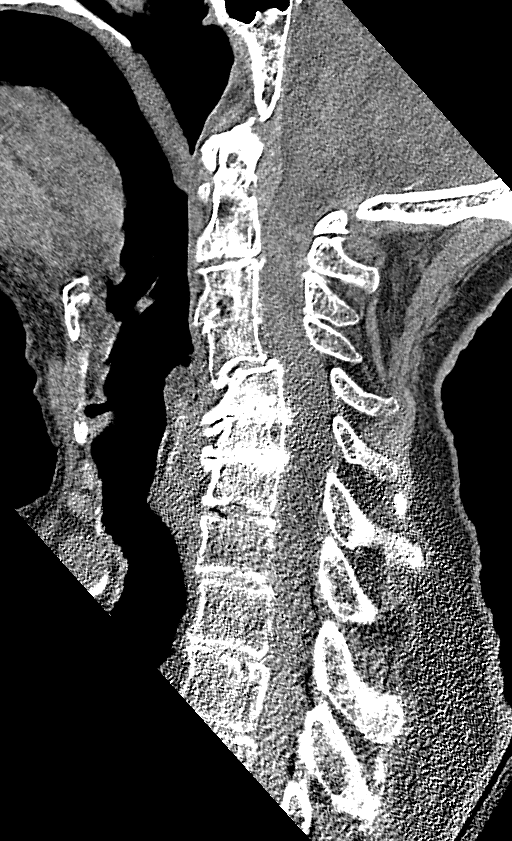
[im 33/65  bone]
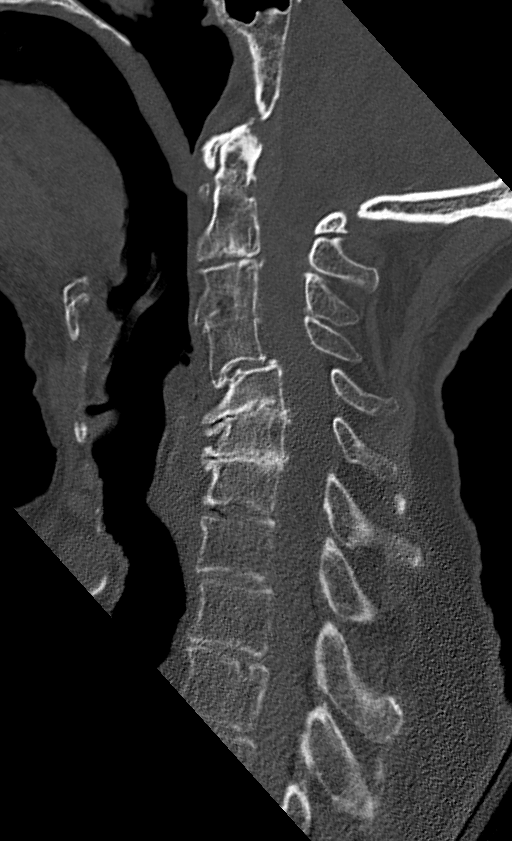
[im 38/65  bone]
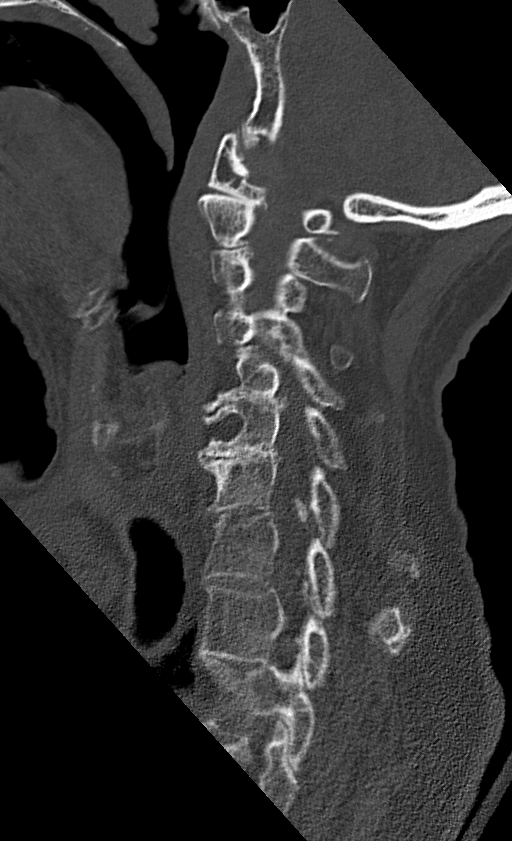
[im 43/65  bone]
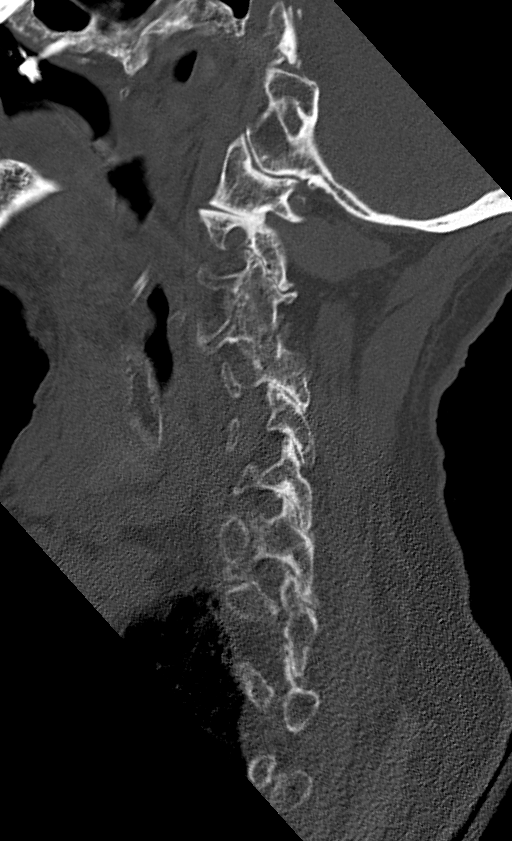

[12 of 28 positions shown; findings below may reference images not displayed]

FINDINGS: CT HEAD FINDINGS

Brain: No acute territorial infarction, hemorrhage or intracranial
mass. Moderate atrophy. Moderate hypodensity in the white matter
consistent with chronic small vessel ischemic change. Chronic
appearing lacunar infarct in the left basal ganglia. Nonenlarged
ventricles

Vascular: No hyperdense vessels.  Carotid vascular calcification

Skull: Normal. Negative for fracture or focal lesion.

Sinuses/Orbits: No acute finding.

Other: None

CT CERVICAL SPINE FINDINGS

Alignment: Generalized straightening. 6 mm anterolisthesis C4 on C5.
Facet alignment is maintained.

Skull base and vertebrae: No acute fracture. No primary bone lesion
or focal pathologic process.

Soft tissues and spinal canal: No prevertebral fluid or swelling. No
visible canal hematoma.

Disc levels: Advanced degenerative changes throughout the cervical
spine with multiple level disc space narrowing osteophyte.
Hypertrophic facet degenerative changes at multiple levels with
foraminal narrowing.

Upper chest: Apical scarring with calcifications on the right.

Other: None
IMPRESSION: 1. No CT evidence for acute intracranial abnormality. Atrophy and
chronic small vessel ischemic changes of the white matter.
2. Straightening of the cervical spine with 6 mm anterolisthesis C4
on C5. Advanced degenerative changes throughout the cervical spine.
No acute fracture is seen
